# Patient Record
Sex: Female | Born: 1995 | Race: White | Hispanic: No | Marital: Single | Smoking: Current every day smoker
Health system: Southern US, Community
[De-identification: ages and names within clinical notes are randomized; demographics above are authoritative.]

## PROBLEM LIST (undated history)

## (undated) DIAGNOSIS — F1110 Opioid abuse, uncomplicated: Secondary | ICD-10-CM

## (undated) DIAGNOSIS — F1920 Other psychoactive substance dependence, uncomplicated: Secondary | ICD-10-CM

---
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
## 2017-11-03 ENCOUNTER — Telehealth: Payer: Self-pay | Admitting: Family Medicine

## 2017-11-03 NOTE — Telephone Encounter (Signed)
Spoke with sister and left a call back number regarding health maintenance appt.

## 2017-11-19 ENCOUNTER — Other Ambulatory Visit: Payer: Self-pay

## 2017-11-19 ENCOUNTER — Emergency Department (HOSPITAL_BASED_OUTPATIENT_CLINIC_OR_DEPARTMENT_OTHER)
Admission: EM | Admit: 2017-11-19 | Discharge: 2017-11-19 | Disposition: A | Discharge status: Home / Self Care | Payer: Medicaid Other | Attending: Emergency Medicine | Admitting: Emergency Medicine

## 2017-11-19 ENCOUNTER — Encounter (HOSPITAL_BASED_OUTPATIENT_CLINIC_OR_DEPARTMENT_OTHER): Payer: Self-pay | Admitting: Emergency Medicine

## 2017-11-19 DIAGNOSIS — F17210 Nicotine dependence, cigarettes, uncomplicated: Secondary | ICD-10-CM | POA: Diagnosis not present

## 2017-11-19 DIAGNOSIS — T401X1A Poisoning by heroin, accidental (unintentional), initial encounter: Secondary | ICD-10-CM | POA: Diagnosis not present

## 2017-11-19 HISTORY — DX: Opioid abuse, uncomplicated: F11.10

## 2017-11-19 MED ORDER — ONDANSETRON 8 MG PO TBDP
8.0000 mg | ORAL_TABLET | Freq: Once | ORAL | Status: AC
Start: 2017-11-19 — End: 2017-11-19
  Administered 2017-11-19: 8 mg via ORAL
  Filled 2017-11-19: qty 1

## 2017-11-19 NOTE — ED Provider Notes (Signed)
[facility] Provider Note: Lowella Dell, MD, FACEP  CSN: [id number] MRN: [id number] ARRIVAL: 11/19/17 at 0325 ROOM: MH05/MH05   CHIEF COMPLAINT  Drug Overdose   HISTORY OF PRESENT ILLNESS  11/19/17 3:36 AM Kathryn Blankenship is a 22 y.o. female with a long-standing history of heroin abuse.  Her father returned to their hotel room time ago and found her unresponsive with agonal breathing.  He administered 4 mg of IM Narcan.  Her breathing did not return to normal so he called 911.  First responders administered an additional 2 mg of intranasal Narcan with return to normal respirations.  She is now awake and alert.  She is tremulous which she attributes to narcotic withdrawal and is complaining of nausea and a headache.    Past Medical History:  Diagnosis Date  . Drug abuse and dependence (HCC)   . Heroin abuse [facility])     Past Surgical History:  Procedure Laterality Date  . I&D EXTREMITY Left 09/24/2015   Procedure: IRRIGATION AND DEBRIDEMENT LEFT FOREARM ABSCESS;  Surgeon: Dairl Ponder, MD;  Location: [facility] OR;  Service: Orthopedics;  Laterality: Left;  . INCISION AND DRAINAGE ABSCESS N/A 09/24/2015   Procedure: INCISION AND DRAINAGE BACK  ABSCESS;  Surgeon: Axel Filler, MD;  Location: [facility] OR;  Service: General;  Laterality: N/A;  . IRRIGATION AND DEBRIDEMENT ABSCESS N/A 09/28/2015   Procedure: IRRIGATION AND DEBRIDEMENT ABSCESS;  Surgeon: Jimmye Norman, MD;  Location: [facility] OR;  Service: General;  Laterality: N/A;    No family history on file.  Social History   Tobacco Use  . Smoking status: Current Some Day Smoker    Packs/day: 0.25    Types: Cigarettes  . Smokeless tobacco: Never Used  Substance Use Topics  . Alcohol use: No  . Drug use: Yes    Frequency: 7.0 times per week    Types: Marijuana, Heroin, IV    Comment: Opana, Soma, hx Heroin    Prior to Admission medications   Not on File    Allergies Patient has no known allergies.   REVIEW OF  SYSTEMS  Negative except as noted here or in the History of Present Illness.   PHYSICAL EXAMINATION  Initial Vital Signs Blood pressure 128/67, pulse (!) 101, temperature 98.5 F (36.9 C), temperature source Oral, resp. rate 15, height 5\' 5"  (1.651 m), weight 61.2 kg, SpO2 100 %.  Examination General: Well-developed, well-nourished female in no acute distress; appearance consistent with age of record HENT: normocephalic; atraumatic Eyes: pupils dilated and sluggish; extraocular muscles intact Neck: supple Heart: regular rate and rhythm Lungs: clear to auscultation bilaterally Abdomen: soft; nondistended; nontender; bowel sounds present Extremities: No deformity; full range of motion; pulses normal Neurologic: Awake, alert and oriented; motor function intact in all extremities and symmetric; no facial droop; tremulous Skin: Warm and dry Psychiatric: Flat affect   RESULTS  Summary of this visit's results, reviewed by myself:   EKG Interpretation  Date/Time:    Ventricular Rate:    PR Interval:    QRS Duration:   QT Interval:    QTC Calculation:   R Axis:     Text Interpretation:        Laboratory Studies: No results found for this or any previous visit (from the past 24 hour(s)). Imaging Studies: No results found.  ED COURSE and MDM  Nursing notes and initial vitals signs, including pulse oximetry, reviewed.  Vitals:   11/19/17 0331 11/19/17 0332 11/19/17 0400  BP:  128/67   Pulse:  Marland Kitchen)  101 97  Resp:  15 17  Temp:  98.5 F (36.9 C)   TempSrc:  Oral   SpO2:  100% 100%  Weight: 61.2 kg    Height: 5\' 5"  (1.651 m)     6:37 AM Vital signs have been stable, patient without apnea during observation in ED.  Patient sleeping but readily awakened.  PROCEDURES    ED DIAGNOSES     ICD-10-CM   1. Accidental overdose of heroin, initial encounter [facility]) T40.1X1A        Paula Libra, MD 11/19/17 [phone]

## 2017-11-19 NOTE — ED Triage Notes (Signed)
Pt was found by her father in the home unresponsive. Pt was given 6mg  narcan intranasal by [employer]. Pt brought in by EMS. Pt is alert and following commands,.

## 2018-10-01 ENCOUNTER — Encounter (HOSPITAL_BASED_OUTPATIENT_CLINIC_OR_DEPARTMENT_OTHER): Payer: Self-pay | Admitting: *Deleted

## 2018-10-01 ENCOUNTER — Other Ambulatory Visit: Payer: Self-pay

## 2018-10-01 ENCOUNTER — Emergency Department (HOSPITAL_BASED_OUTPATIENT_CLINIC_OR_DEPARTMENT_OTHER)
Admission: EM | Admit: 2018-10-01 | Discharge: 2018-10-02 | Disposition: A | Discharge status: Home / Self Care | Payer: Medicaid Other | Attending: Emergency Medicine | Admitting: Emergency Medicine

## 2018-10-01 DIAGNOSIS — L0291 Cutaneous abscess, unspecified: Secondary | ICD-10-CM

## 2018-10-01 DIAGNOSIS — N764 Abscess of vulva: Secondary | ICD-10-CM | POA: Insufficient documentation

## 2018-10-01 DIAGNOSIS — F17210 Nicotine dependence, cigarettes, uncomplicated: Secondary | ICD-10-CM | POA: Insufficient documentation

## 2018-10-01 MED ORDER — ACETAMINOPHEN 500 MG PO TABS
1000.0000 mg | ORAL_TABLET | Freq: Once | ORAL | Status: AC
  Administered 2018-10-01: 1000 mg via ORAL
  Filled 2018-10-01: qty 2

## 2018-10-01 MED ORDER — DOXYCYCLINE HYCLATE 100 MG PO TABS
100.0000 mg | ORAL_TABLET | Freq: Once | ORAL | Status: AC
  Administered 2018-10-01: 100 mg via ORAL
  Filled 2018-10-01: qty 1

## 2018-10-01 MED ORDER — NAPROXEN 250 MG PO TABS
500.0000 mg | ORAL_TABLET | Freq: Once | ORAL | Status: AC
  Administered 2018-10-01: 500 mg via ORAL
  Filled 2018-10-01: qty 2

## 2018-10-01 NOTE — ED Triage Notes (Signed)
Pt c/o right thigh abscess x 2 weeks

## 2018-10-02 ENCOUNTER — Encounter (HOSPITAL_BASED_OUTPATIENT_CLINIC_OR_DEPARTMENT_OTHER): Payer: Self-pay | Admitting: Emergency Medicine

## 2018-10-02 MED ORDER — LIDOCAINE-EPINEPHRINE (PF) 2 %-1:200000 IJ SOLN
INTRAMUSCULAR | Status: AC
  Filled 2018-10-02: qty 10

## 2018-10-02 MED ORDER — NAPROXEN 500 MG PO TABS: 500.0000 mg | ORAL_TABLET | Freq: Two times a day (BID) | ORAL | 0 refills | Status: DC

## 2018-10-02 MED ORDER — DOXYCYCLINE HYCLATE 100 MG PO CAPS: 100.0000 mg | ORAL_CAPSULE | Freq: Two times a day (BID) | ORAL | 0 refills | Status: DC

## 2018-10-02 NOTE — Discharge Instructions (Signed)
No shaving  °

## 2018-10-02 NOTE — ED Provider Notes (Signed)
[facility] EMERGENCY DEPARTMENT Provider Note   CSN: [id number] Arrival date & time: 10/01/18  2250     History   Chief Complaint Chief Complaint  Patient presents with  . Abscess    HPI Kathryn Blankenship is a 23 y.o. female.     The history is provided by the patient.  Abscess Location:  Pelvis Pelvic abscess location:  Vulva Size:  3 Abscess quality: fluctuance and painful   Red streaking: no   Duration:  2 weeks Progression:  Worsening Pain details:    Quality:  Dull   Severity:  Severe   Duration:  2 weeks   Timing:  Constant   Progression:  Worsening Chronicity:  Recurrent Context: not immunosuppression   Associated symptoms: no fever and no vomiting   Patient with MRSA and h/o heroin abuse with overdose presents with R labial abscess x 2 weeks.  No f/c/r.  No n/v/d.  No streaking.  No drainage.    Past Medical History:  Diagnosis Date  . Drug abuse and dependence (HCC)   . Heroin abuse [facility])     Patient Active Problem List   Diagnosis Date Noted  . H/O drug dependence/abuse (HCC) 11/15/2015  . 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 Disruption of tissue around surgical drain 10/24/2015  . Hepatitis C antibody test positive 09/26/2015  . 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 Abscess of left arm 09/25/2015  . Normocytic anemia 09/25/2015  . Protein-calorie malnutrition, severe (HCC) 09/25/2015  . Cigarette smoker 09/25/2015  . 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 Heroin abuse (HCC) 09/24/2015  . Sepsis (HCC) 09/24/2015    Past Surgical History:  Procedure Laterality Date  . I&D EXTREMITY Left 09/24/2015   Procedure: IRRIGATION AND DEBRIDEMENT LEFT FOREARM ABSCESS;  Surgeon: Dairl Ponder, MD;  Location: [facility] OR;  Service: Orthopedics;  Laterality: Left;  . INCISION AND DRAINAGE ABSCESS N/A 09/24/2015   Procedure: INCISION AND DRAINAGE BACK  ABSCESS;  Surgeon: Axel Filler, MD;  Location: [facility] OR;  Service: General;  Laterality:  N/A;  . IRRIGATION AND DEBRIDEMENT ABSCESS N/A 09/28/2015   Procedure: IRRIGATION AND DEBRIDEMENT ABSCESS;  Surgeon: Jimmye Norman, MD;  Location: [facility] OR;  Service: General;  Laterality: N/A;     OB History   No obstetric history on file.      Home Medications    Prior to Admission medications   Medication Sig Start Date End Date Taking? Authorizing Provider  doxycycline (VIBRAMYCIN) 100 MG capsule Take 1 capsule (100 mg total) by mouth 2 (two) times daily. One po bid x 7 days 10/02/18   Myrian Botello, MD  naproxen (NAPROSYN) 500 MG tablet Take 1 tablet (500 mg total) by mouth 2 (two) times daily with a meal. 10/02/18   Gianina Olinde, MD    Family History No family history on file.  Social History Social History   Tobacco Use  . Smoking status: Current Some Day Smoker    Packs/day: 0.50    Types: Cigarettes  . Smokeless tobacco: Never Used  Substance Use Topics  . Alcohol use: No  . Drug use: Yes    Frequency: 7.0 times per week    Types: Marijuana, Heroin, IV    Comment: Opana, Soma, hx Heroin     Allergies   Penicillins   Review of Systems Review of Systems  Constitutional: Negative for fever.  HENT: Negative for congestion.   Eyes: Negative for visual disturbance.  Respiratory: Negative for wheezing.   Cardiovascular: Negative for  chest pain.  Gastrointestinal: Negative for abdominal distention, diarrhea and vomiting.  Genitourinary: Negative for difficulty urinating.  Musculoskeletal: Negative for arthralgias.  Skin: Negative for wound.  Neurological: Negative for dizziness.  Psychiatric/Behavioral: Negative for agitation.  All other systems reviewed and are negative.    Physical Exam Updated Vital Signs BP 101/77   Pulse (!) 102   Temp 99 F (37.2 C) (Oral)   Resp 16   Ht 5\' 5"  (1.651 m)   Wt 50.3 kg   LMP 08/26/2018   SpO2 100%   BMI 18.47 kg/m   Physical Exam Vitals signs and nursing note reviewed.  Constitutional:      General: She is  not in acute distress.    Appearance: She is normal weight.  HENT:     Head: Normocephalic and atraumatic.     Nose: Nose normal.  Eyes:     Conjunctiva/sclera: Conjunctivae normal.     Pupils: Pupils are equal, round, and reactive to light.  Neck:     Musculoskeletal: Normal range of motion and neck supple.  Cardiovascular:     Rate and Rhythm: Normal rate and regular rhythm.     Pulses: Normal pulses.     Heart sounds: Normal heart sounds.  Pulmonary:     Effort: Pulmonary effort is normal.     Breath sounds: Normal breath sounds.  Abdominal:     General: Abdomen is flat. Bowel sounds are normal.     Tenderness: There is no abdominal tenderness. There is no guarding or rebound.  Genitourinary:    Comments: Labial abscess inspected with chaperone Kathryn Blankenship present.   Musculoskeletal: Normal range of motion.  Skin:    General: Skin is warm and dry.       Neurological:     General: No focal deficit present.     Mental Status: She is alert and oriented to person, place, and time.  Psychiatric:        Mood and Affect: Mood normal.        Behavior: Behavior normal.      ED Treatments / Results  Labs (all labs ordered are listed, but only abnormal results are displayed) Labs Reviewed - No data to display  EKG None  Radiology No results found.  Procedures .Marland Kitchen.Incision and Drainage  Date/Time: 10/02/2018 12:14 AM Performed by: Cy BlamerPalumbo, Elfreida Heggs, MD Authorized by: Cy BlamerPalumbo, Kosisochukwu Goldberg, MD   Consent:    Consent obtained:  Verbal   Consent given by:  Patient   Risks discussed:  Bleeding, damage to other organs, infection, incomplete drainage and pain   Alternatives discussed:  No treatment Location:    Type:  Abscess (labial R)   Size:  3   Location:  Anogenital   Anogenital location: labial R. Pre-procedure details:    Skin preparation:  Betadine and Chloraprep (4) Anesthesia (see MAR for exact dosages):    Anesthesia method:  Local infiltration   Local anesthetic:   Lidocaine 1% WITH epi Procedure type:    Complexity:  Simple Procedure details:    Incision types:  Elliptical   Incision depth:  Subcutaneous   Scalpel blade:  11   Wound management:  Probed and deloculated   Drainage:  Purulent   Drainage amount:  Copious   Wound treatment:  Wound left open   Packing materials:  None Post-procedure details:    Patient tolerance of procedure:  Tolerated well, no immediate complications   (including critical care time)  Medications Ordered in ED Medications  lidocaine-EPINEPHrine (XYLOCAINE W/EPI)  2 %-1:200000 (PF) injection (has no administration in time range)  doxycycline (VIBRA-TABS) tablet 100 mg (100 mg Oral Given 10/01/18 2351)  acetaminophen (TYLENOL) tablet 1,000 mg (1,000 mg Oral Given 10/01/18 2351)  naproxen (NAPROSYN) tablet 500 mg (500 mg Oral Given 10/01/18 2351)     Copious purulent drainage.  No shaving.  Take all antibiotics, wound care instructions given.  Will cover with Doxycycline given history of MRSA.  I will not be giving narcotics given ongoing heroin abuse and history of overdose.    Channelle Bottger was evaluated in Emergency Department on 10/02/2018 for the symptoms described in the history of present illness. She was evaluated in the context of the global COVID-19 pandemic, which necessitated consideration that the patient might be at risk for infection with the SARS-CoV-2 virus that causes COVID-19. Institutional protocols and algorithms that pertain to the evaluation of patients at risk for COVID-19 are in a state of rapid change based on information released by regulatory bodies including the CDC and federal and state organizations. These policies and algorithms were followed during the patient's care in the ED.   Final Clinical Impressions(s) / ED Diagnoses   Final diagnoses:  Abscess   Return for intractable cough, coughing up blood,fevers >100.4 unrelieved by medication, shortness of breath, intractable  vomiting, chest pain, shortness of breath, weakness,numbness, changes in speech, facial asymmetry,abdominal pain, passing out,Inability to tolerate liquids or food, cough, altered mental status or any concerns. No signs of systemic illness or infection. The patient is nontoxic-appearing on exam and vital signs are within normal limits.   I have reviewed the triage vital signs and the nursing notes. Pertinent labs &imaging results that were available during my care of the patient were reviewed by me and considered in my medical decision making (see chart for details).  After history, exam, and medical workup I feel the patient has been appropriately medically screened and is safe for discharge home. Pertinent diagnoses were discussed with the patient. Patient was given return precautions  ED Discharge Orders         Ordered    naproxen (NAPROSYN) 500 MG tablet  2 times daily with meals     10/02/18 0013    doxycycline (VIBRAMYCIN) 100 MG capsule  2 times daily     10/02/18 0013           Darah Simkin, MD 10/02/18 0020

## 2019-03-03 ENCOUNTER — Emergency Department (HOSPITAL_BASED_OUTPATIENT_CLINIC_OR_DEPARTMENT_OTHER)
Admission: EM | Admit: 2019-03-03 | Discharge: 2019-03-03 | Disposition: A | Discharge status: Home / Self Care | Payer: Medicaid Other | Attending: Emergency Medicine | Admitting: Emergency Medicine

## 2019-03-03 ENCOUNTER — Encounter (HOSPITAL_BASED_OUTPATIENT_CLINIC_OR_DEPARTMENT_OTHER): Payer: Self-pay

## 2019-03-03 ENCOUNTER — Other Ambulatory Visit: Payer: Self-pay

## 2019-03-03 DIAGNOSIS — Z711 Person with feared health complaint in whom no diagnosis is made: Secondary | ICD-10-CM

## 2019-03-03 DIAGNOSIS — N898 Other specified noninflammatory disorders of vagina: Secondary | ICD-10-CM | POA: Insufficient documentation

## 2019-03-03 DIAGNOSIS — N730 Acute parametritis and pelvic cellulitis: Secondary | ICD-10-CM | POA: Insufficient documentation

## 2019-03-03 DIAGNOSIS — F17210 Nicotine dependence, cigarettes, uncomplicated: Secondary | ICD-10-CM | POA: Insufficient documentation

## 2019-03-03 DIAGNOSIS — Z202 Contact with and (suspected) exposure to infections with a predominantly sexual mode of transmission: Secondary | ICD-10-CM | POA: Insufficient documentation

## 2019-03-03 LAB — URINALYSIS, ROUTINE W REFLEX MICROSCOPIC
Bilirubin Urine: NEGATIVE
Glucose, UA: NEGATIVE mg/dL
Ketones, ur: NEGATIVE mg/dL
Nitrite: POSITIVE — AB
Protein, ur: 100 mg/dL — AB
Specific Gravity, Urine: >1.03 — ABNORMAL HIGH (ref 1.005–1.030)
pH: 6 (ref 5.0–8.0)

## 2019-03-03 LAB — URINALYSIS, MICROSCOPIC (REFLEX)

## 2019-03-03 LAB — PREGNANCY, URINE: Preg Test, Ur: NEGATIVE

## 2019-03-03 MED ORDER — METRONIDAZOLE 500 MG PO TABS
2000.0000 mg | ORAL_TABLET | Freq: Once | ORAL | Status: AC
  Administered 2019-03-03: 2000 mg via ORAL
  Filled 2019-03-03: qty 4

## 2019-03-03 MED ORDER — CEFTRIAXONE SODIUM 500 MG IJ SOLR
500.0000 mg | Freq: Once | INTRAMUSCULAR | Status: AC
  Administered 2019-03-03: 500 mg via INTRAMUSCULAR
  Filled 2019-03-03: qty 500

## 2019-03-03 MED ORDER — DOXYCYCLINE HYCLATE 100 MG PO CAPS: 100.0000 mg | ORAL_CAPSULE | Freq: Two times a day (BID) | ORAL | 0 refills | Status: DC

## 2019-03-03 NOTE — ED Notes (Signed)
ED Provider at bedside. 

## 2019-03-03 NOTE — ED Notes (Signed)
Informed nurse that she has not had a prior pelvic exam. ED PA informed of same

## 2019-03-03 NOTE — ED Triage Notes (Signed)
Pt c/o vaginal d/c x 4 days-NAD-steady gait

## 2019-03-03 NOTE — ED Provider Notes (Signed)
[facility] EMERGENCY DEPARTMENT Provider Note   CSN: [id number] Arrival date & time: 03/03/19  1306     History Chief Complaint  Patient presents with  . Vaginal Discharge    Kathryn Blankenship is a 23 y.o. female.  HPI Patient presents to the emergency department with vaginal discharge that started 4 days ago.  The patient states that she has pain with urination and lower pelvic pain as well.  The patient states that she has had recent unprotected sex.  She states that she is never had an STD in the past.  The patient states that the pain increases after urination.  Patient denies any external rashes.  The patient denies chest pain, shortness of breath, headache,blurred vision, neck pain, fever, cough, weakness, numbness, dizziness, anorexia, edema,, nausea, vomiting, diarrhea, rash, back pain, dysuria, hematemesis, bloody stool, near syncope, or syncope.  Past Medical History:  Diagnosis Date  . Drug abuse and dependence (HCC)   . Heroin abuse [facility])     Patient Active Problem List   Diagnosis Date Noted  . H/O drug dependence/abuse (HCC) 11/15/2015  . 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 Disruption of tissue around surgical drain 10/24/2015  . Hepatitis C antibody test positive 09/26/2015  . 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 Abscess of left arm 09/25/2015  . Normocytic anemia 09/25/2015  . Protein-calorie malnutrition, severe (HCC) 09/25/2015  . Cigarette smoker 09/25/2015  . 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 Heroin abuse (HCC) 09/24/2015  . Sepsis (HCC) 09/24/2015    Past Surgical History:  Procedure Laterality Date  . I & D EXTREMITY Left 09/24/2015   Procedure: IRRIGATION AND DEBRIDEMENT LEFT FOREARM ABSCESS;  Surgeon: Dairl Ponder, MD;  Location: [facility] OR;  Service: Orthopedics;  Laterality: Left;  . INCISION AND DRAINAGE ABSCESS N/A 09/24/2015   Procedure: INCISION AND DRAINAGE BACK  ABSCESS;  Surgeon: Axel Filler, MD;  Location: [facility] OR;   Service: General;  Laterality: N/A;  . IRRIGATION AND DEBRIDEMENT ABSCESS N/A 09/28/2015   Procedure: IRRIGATION AND DEBRIDEMENT ABSCESS;  Surgeon: Jimmye Norman, MD;  Location: [facility] OR;  Service: General;  Laterality: N/A;     OB History   No obstetric history on file.     No family history on file.  Social History   Tobacco Use  . Smoking status: Current Some Day Smoker    Packs/day: 0.50    Types: Cigarettes  . Smokeless tobacco: Never Used  Substance Use Topics  . Alcohol use: No  . Drug use: Not Currently    Types: Heroin    Home Medications Prior to Admission medications   Medication Sig Start Date End Date Taking? Authorizing Provider  doxycycline (VIBRAMYCIN) 100 MG capsule Take 1 capsule (100 mg total) by mouth 2 (two) times daily. One po bid x 7 days 10/02/18   Palumbo, April, MD  naproxen (NAPROSYN) 500 MG tablet Take 1 tablet (500 mg total) by mouth 2 (two) times daily with a meal. 10/02/18   Palumbo, April, MD    Allergies    Penicillins  Review of Systems   Review of Systems All other systems negative except as documented in the HPI. All pertinent positives and negatives as reviewed in the HPI. Physical Exam Updated Vital Signs BP (!) 98/56 (BP Location: Right Arm)   Pulse 60   Temp 97.7 F (36.5 C) (Oral)   Resp 16   Ht 5\' 5"  (1.651 m)   Wt 48.6 kg   LMP 02/24/2019 (Approximate)  SpO2 100%   BMI 17.84 kg/m   Physical Exam Vitals and nursing note reviewed. Exam conducted with a chaperone present.  Constitutional:      General: She is not in acute distress.    Appearance: She is well-developed.  HENT:     Head: Normocephalic and atraumatic.  Eyes:     Pupils: Pupils are equal, round, and reactive to light.  Cardiovascular:     Rate and Rhythm: Normal rate and regular rhythm.  Pulmonary:     Effort: Pulmonary effort is normal.  Abdominal:     Tenderness: There is abdominal tenderness in the suprapubic area.  Genitourinary:    Pubic Area: No  rash.      Labia:        Right: No rash, tenderness or lesion.        Left: No rash, tenderness or lesion.      Vagina: Vaginal discharge present.     Cervix: Cervical motion tenderness present.  Skin:    General: Skin is warm and dry.  Neurological:     Mental Status: She is alert and oriented to person, place, and time.     ED Results / Procedures / Treatments   Labs (all labs ordered are listed, but only abnormal results are displayed) Labs Reviewed  URINALYSIS, ROUTINE W REFLEX MICROSCOPIC - Abnormal; Notable for the following components:      Result Value   Color, Urine AMBER (*)    APPearance TURBID (*)    Specific Gravity, Urine >1.030 (*)    Hgb urine dipstick LARGE (*)    Protein, ur 100 (*)    Nitrite POSITIVE (*)    Leukocytes,Ua MODERATE (*)    All other components within normal limits  URINALYSIS, MICROSCOPIC (REFLEX) - Abnormal; Notable for the following components:   Bacteria, UA MANY (*)    Trichomonas, UA PRESENT (*)    All other components within normal limits  PREGNANCY, URINE  GC/CHLAMYDIA PROBE AMP ([place]) NOT AT [facility]    EKG None  Radiology No results found.  Procedures Procedures (including critical care time)  Medications Ordered in ED Medications  cefTRIAXone (ROCEPHIN) injection 500 mg (500 mg Intramuscular Given 03/03/19 1544)  metroNIDAZOLE (FLAGYL) tablet 2,000 mg (2,000 mg Oral Given 03/03/19 1544)    ED Course  I have reviewed the triage vital signs and the nursing notes.  Pertinent labs & imaging results that were available during my care of the patient were reviewed by me and considered in my medical decision making (see chart for details).    MDM Rules/Calculators/A&P                      Patient will be treated for STDs.  The patient is advised to not have any sexual contact over the next 7 days.  Patient agrees the plan and all questions were answered. Final Clinical Impression(s) / ED Diagnoses Final diagnoses:    None    Rx / DC Orders ED Discharge Orders    None       Dalia Heading, PA-C 03/03/19 1606    Fredia Sorrow, MD 03/13/19 [phone]

## 2019-03-03 NOTE — Discharge Instructions (Addendum)
Return here as needed.  Make sure you take the full course of the antibiotics.  No sexual contact over the next 7 days.

## 2019-03-04 LAB — GC/CHLAMYDIA PROBE AMP (~~LOC~~) NOT AT ARMC
Chlamydia: POSITIVE — AB
Neisseria Gonorrhea: POSITIVE — AB

## 2020-01-25 ENCOUNTER — Other Ambulatory Visit: Payer: Self-pay

## 2020-01-25 ENCOUNTER — Emergency Department (HOSPITAL_COMMUNITY): Payer: Medicaid Other

## 2020-01-25 ENCOUNTER — Inpatient Hospital Stay (HOSPITAL_COMMUNITY)
Admission: AD | Admit: 2020-01-25 | Discharge: 2020-01-26 | Disposition: A | Discharge status: Home / Self Care | Payer: Medicaid Other | Attending: Emergency Medicine | Admitting: Emergency Medicine

## 2020-01-25 DIAGNOSIS — O98511 Other viral diseases complicating pregnancy, first trimester: Secondary | ICD-10-CM | POA: Insufficient documentation

## 2020-01-25 DIAGNOSIS — Z3A09 9 weeks gestation of pregnancy: Secondary | ICD-10-CM | POA: Insufficient documentation

## 2020-01-25 DIAGNOSIS — F1110 Opioid abuse, uncomplicated: Secondary | ICD-10-CM

## 2020-01-25 DIAGNOSIS — F1120 Opioid dependence, uncomplicated: Secondary | ICD-10-CM | POA: Insufficient documentation

## 2020-01-25 DIAGNOSIS — O99331 Smoking (tobacco) complicating pregnancy, first trimester: Secondary | ICD-10-CM | POA: Diagnosis not present

## 2020-01-25 DIAGNOSIS — O99321 Drug use complicating pregnancy, first trimester: Secondary | ICD-10-CM | POA: Diagnosis not present

## 2020-01-25 DIAGNOSIS — O26891 Other specified pregnancy related conditions, first trimester: Secondary | ICD-10-CM | POA: Diagnosis present

## 2020-01-25 DIAGNOSIS — A599 Trichomoniasis, unspecified: Secondary | ICD-10-CM | POA: Diagnosis not present

## 2020-01-25 DIAGNOSIS — O30031 Twin pregnancy, monochorionic/diamniotic, first trimester: Secondary | ICD-10-CM

## 2020-01-25 DIAGNOSIS — U071 COVID-19: Secondary | ICD-10-CM | POA: Diagnosis not present

## 2020-01-25 DIAGNOSIS — Z3A08 8 weeks gestation of pregnancy: Secondary | ICD-10-CM

## 2020-01-25 DIAGNOSIS — Z3490 Encounter for supervision of normal pregnancy, unspecified, unspecified trimester: Secondary | ICD-10-CM

## 2020-01-25 DIAGNOSIS — F17210 Nicotine dependence, cigarettes, uncomplicated: Secondary | ICD-10-CM | POA: Diagnosis not present

## 2020-01-25 LAB — CBC WITH DIFFERENTIAL/PLATELET
Abs Immature Granulocytes: 0.04 10*3/uL (ref 0.00–0.07)
Basophils Absolute: 0 10*3/uL (ref 0.0–0.1)
Basophils Relative: 0 %
Eosinophils Absolute: 0 10*3/uL (ref 0.0–0.5)
Eosinophils Relative: 0 %
HCT: 33.4 % — ABNORMAL LOW (ref 36.0–46.0)
Hemoglobin: 10.3 g/dL — ABNORMAL LOW (ref 12.0–15.0)
Immature Granulocytes: 1 %
Lymphocytes Relative: 13 %
Lymphs Abs: 0.6 10*3/uL — ABNORMAL LOW (ref 0.7–4.0)
MCH: 24.6 pg — ABNORMAL LOW (ref 26.0–34.0)
MCHC: 30.8 g/dL (ref 30.0–36.0)
MCV: 79.7 fL — ABNORMAL LOW (ref 80.0–100.0)
Monocytes Absolute: 0.9 10*3/uL (ref 0.1–1.0)
Monocytes Relative: 18 %
Neutro Abs: 3.4 10*3/uL (ref 1.7–7.7)
Neutrophils Relative %: 68 %
Platelets: 288 10*3/uL (ref 150–400)
RBC: 4.19 MIL/uL (ref 3.87–5.11)
RDW: 15.5 % (ref 11.5–15.5)
WBC: 4.9 10*3/uL (ref 4.0–10.5)
nRBC: 0 % (ref 0.0–0.2)

## 2020-01-25 LAB — URINALYSIS, ROUTINE W REFLEX MICROSCOPIC
Bilirubin Urine: NEGATIVE
Glucose, UA: NEGATIVE mg/dL
Ketones, ur: 5 mg/dL — AB
Nitrite: NEGATIVE
Protein, ur: NEGATIVE mg/dL
Specific Gravity, Urine: 1.021 (ref 1.005–1.030)
pH: 5 (ref 5.0–8.0)

## 2020-01-25 LAB — COMPREHENSIVE METABOLIC PANEL
ALT: 37 U/L (ref 0–44)
AST: 28 U/L (ref 15–41)
Albumin: 3.8 g/dL (ref 3.5–5.0)
Alkaline Phosphatase: 45 U/L (ref 38–126)
Anion gap: 11 (ref 5–15)
BUN: 8 mg/dL (ref 6–20)
CO2: 21 mmol/L — ABNORMAL LOW (ref 22–32)
Calcium: 8.9 mg/dL (ref 8.9–10.3)
Chloride: 97 mmol/L — ABNORMAL LOW (ref 98–111)
Creatinine, Ser: 0.62 mg/dL (ref 0.44–1.00)
GFR, Estimated: >60 mL/min (ref >60)
Glucose, Bld: 97 mg/dL (ref 70–99)
Potassium: 3.8 mmol/L (ref 3.5–5.1)
Sodium: 129 mmol/L — ABNORMAL LOW (ref 135–145)
Total Bilirubin: 0.7 mg/dL (ref 0.3–1.2)
Total Protein: 7.8 g/dL (ref 6.5–8.1)

## 2020-01-25 LAB — I-STAT BETA HCG BLOOD, ED (MC, WL, AP ONLY): I-stat hCG, quantitative: >2000 m[IU]/mL — ABNORMAL HIGH (ref <5)

## 2020-01-25 NOTE — ED Triage Notes (Signed)
PA Mia screened pt, pt currently endorsing covid symptoms (loss of smell & taste, can't breathe, bodyaches, nauseous). Known contact with covid+ dad.

## 2020-01-25 NOTE — ED Triage Notes (Signed)
Pt BIB [employer], needs medical screening to go to jail. Previous temp reported by [employer] was 100.0, temp on arrival 98.7. PA Mia notified to come screen pt for [facility] dispo.

## 2020-01-26 ENCOUNTER — Inpatient Hospital Stay (HOSPITAL_COMMUNITY): Payer: Medicaid Other

## 2020-01-26 ENCOUNTER — Encounter (HOSPITAL_COMMUNITY): Payer: Self-pay | Admitting: *Deleted

## 2020-01-26 LAB — WET PREP, GENITAL
Clue Cells Wet Prep HPF POC: NONE SEEN
Sperm: NONE SEEN
Yeast Wet Prep HPF POC: NONE SEEN

## 2020-01-26 LAB — RESP PANEL BY RT-PCR (FLU A&B, COVID) ARPGX2
Influenza A by PCR: NEGATIVE
Influenza B by PCR: NEGATIVE
SARS Coronavirus 2 by RT PCR: POSITIVE — AB

## 2020-01-26 LAB — HCG, QUANTITATIVE, PREGNANCY: hCG, Beta Chain, Quant, S: 206867 m[IU]/mL — ABNORMAL HIGH (ref <5)

## 2020-01-26 LAB — GC/CHLAMYDIA PROBE AMP (~~LOC~~) NOT AT ARMC
Chlamydia: NEGATIVE
Comment: NEGATIVE
Comment: NORMAL
Neisseria Gonorrhea: NEGATIVE

## 2020-01-26 MED ORDER — ONDANSETRON 4 MG PO TBDP
8.0000 mg | ORAL_TABLET | Freq: Once | ORAL | Status: AC
  Administered 2020-01-26: 8 mg via ORAL
  Filled 2020-01-26: qty 2

## 2020-01-26 MED ORDER — ONDANSETRON HCL 4 MG/2ML IJ SOLN
INTRAMUSCULAR | Status: AC
  Filled 2020-01-26: qty 2

## 2020-01-26 MED ORDER — METRONIDAZOLE 500 MG PO TABS: 2000.0000 mg | ORAL_TABLET | Freq: Once | ORAL | 0 refills | Status: AC

## 2020-01-26 MED ORDER — METRONIDAZOLE 500 MG PO TABS
2000.0000 mg | ORAL_TABLET | Freq: Once | ORAL | Status: AC
  Administered 2020-01-26: 2000 mg via ORAL
  Filled 2020-01-26: qty 4

## 2020-01-26 NOTE — MAU Provider Note (Signed)
History     CSN: [id number]  Arrival date and time: 01/25/20 2221   Event Date/Time   First Provider Initiated Contact with Patient 01/26/20 0138      Chief Complaint  Patient presents with  . Fever   Kathryn Blankenship is a 24 y.o. G1P0 at [redacted]w[redacted]d by Unsure LMP of 11/19/2019.  She presents today for Fever.  She was transferred from [facility] with positive UPT and c/o abdominal pain and vaginal discharge.  However, upon provider arrival to bedside, patient asleep.  Patient unable to state why she has requested medical evaluation today. However, with extensive prompting, provider able to ilicit some responses. Patient able to state she is having discharge that started 2-3 days ago with odor.  Patient denies pain or vaginal bleeding. Patient reports last drug usage was this morning.     OB History    Gravida  1   Para      Term      Preterm      AB      Living        SAB      IAB      Ectopic      Multiple      Live Births              Past Medical History:  Diagnosis Date  . Drug abuse and dependence (HCC)   . Heroin abuse [facility])     Past Surgical History:  Procedure Laterality Date  . I & D EXTREMITY Left 09/24/2015   Procedure: IRRIGATION AND DEBRIDEMENT LEFT FOREARM ABSCESS;  Surgeon: Dairl Ponder, MD;  Location: [facility] OR;  Service: Orthopedics;  Laterality: Left;  . INCISION AND DRAINAGE ABSCESS N/A 09/24/2015   Procedure: INCISION AND DRAINAGE BACK  ABSCESS;  Surgeon: Axel Filler, MD;  Location: [facility] OR;  Service: General;  Laterality: N/A;  . IRRIGATION AND DEBRIDEMENT ABSCESS N/A 09/28/2015   Procedure: IRRIGATION AND DEBRIDEMENT ABSCESS;  Surgeon: Jimmye Norman, MD;  Location: [facility] OR;  Service: General;  Laterality: N/A;    History reviewed. No pertinent family history.  Social History   Tobacco Use  . Smoking status: Current Some Day Smoker    Packs/day: 0.50    Types: Cigarettes  . Smokeless tobacco: Never Used  Vaping Use  . Vaping Use: Never used   Substance Use Topics  . Alcohol use: No  . Drug use: Not Currently    Types: Heroin    Allergies:  Allergies  Allergen Reactions  . Penicillins Hives    Medications Prior to Admission  Medication Sig Dispense Refill Last Dose  . doxycycline (VIBRAMYCIN) 100 MG capsule Take 1 capsule (100 mg total) by mouth 2 (two) times daily. 28 capsule 0 More than a month at Unknown time  . naproxen (NAPROSYN) 500 MG tablet Take 1 tablet (500 mg total) by mouth 2 (two) times daily with a meal. 8 tablet 0 More than a month at Unknown time    Review of Systems  Constitutional: Positive for chills and fever.  Respiratory: Positive for cough and shortness of breath.   Genitourinary: Negative for difficulty urinating, dysuria, vaginal bleeding and vaginal discharge.   Physical Exam   Blood pressure (!) 109/59, pulse 96, temperature 98.5 F (36.9 C), temperature source Oral, resp. rate 20, height 5\' 6"  (1.676 m), weight 54.4 kg, last menstrual period 11/19/2019, SpO2 99 %.  Physical Exam Constitutional:      Appearance: Normal appearance.  HENT:     Head:  Normocephalic and atraumatic.  Eyes:     Conjunctiva/sclera: Conjunctivae normal.  Cardiovascular:     Rate and Rhythm: Normal rate and regular rhythm.     Heart sounds: Normal heart sounds.  Pulmonary:     Effort: Pulmonary effort is normal. No respiratory distress.     Breath sounds: Normal breath sounds.  Abdominal:     General: Bowel sounds are normal.     Palpations: Abdomen is soft.     Tenderness: There is no abdominal tenderness.  Genitourinary:    Comments: NEFG.  No apparent lesions, trauma, or discharge.  CV collected blindly.  Musculoskeletal:        General: Normal range of motion.     Cervical back: Normal range of motion.  Skin:    General: Skin is warm and dry.     Findings: Bruising (Bilateral Legs likely from drug use) and lesion present.  Neurological:     Mental Status: She is alert and oriented to person,  place, and time.  Psychiatric:        Mood and Affect: Mood normal.        Behavior: Behavior normal.        Thought Content: Thought content normal.     MAU Course  Procedures Results for orders placed or performed during the hospital encounter of 01/25/20 (from the past 24 hour(s))  Resp Panel by RT-PCR (Flu A&B, Covid) Nasopharyngeal Swab     Status: Abnormal   Collection Time: 01/25/20 10:47 PM   Specimen: Nasopharyngeal Swab; Nasopharyngeal(NP) swabs in vial transport medium  Result Value Ref Range   SARS Coronavirus 2 by RT PCR POSITIVE (A) NEGATIVE   Influenza A by PCR NEGATIVE NEGATIVE   Influenza B by PCR NEGATIVE NEGATIVE  Urinalysis, Routine w reflex microscopic Urine, Clean Catch     Status: Abnormal   Collection Time: 01/25/20 10:47 PM  Result Value Ref Range   Color, Urine AMBER (A) YELLOW   APPearance CLOUDY (A) CLEAR   Specific Gravity, Urine 1.021 1.005 - 1.030   pH 5.0 5.0 - 8.0   Glucose, UA NEGATIVE NEGATIVE mg/dL   Hgb urine dipstick SMALL (A) NEGATIVE   Bilirubin Urine NEGATIVE NEGATIVE   Ketones, ur 5 (A) NEGATIVE mg/dL   Protein, ur NEGATIVE NEGATIVE mg/dL   Nitrite NEGATIVE NEGATIVE   Leukocytes,Ua SMALL (A) NEGATIVE   RBC / HPF 6-10 0 - 5 RBC/hpf   WBC, UA 11-20 0 - 5 WBC/hpf   Bacteria, UA FEW (A) NONE SEEN   Squamous Epithelial / LPF 0-5 0 - 5   Mucus PRESENT    Non Squamous Epithelial 0-5 (A) NONE SEEN  CBC with Differential     Status: Abnormal   Collection Time: 01/25/20 10:54 PM  Result Value Ref Range   WBC 4.9 4.0 - 10.5 K/uL   RBC 4.19 3.87 - 5.11 MIL/uL   Hemoglobin 10.3 (L) 12.0 - 15.0 g/dL   HCT 15.4 (L) 00.8 - 67.6 %   MCV 79.7 (L) 80.0 - 100.0 fL   MCH 24.6 (L) 26.0 - 34.0 pg   MCHC 30.8 30.0 - 36.0 g/dL   RDW 19.5 09.3 - 26.7 %   Platelets 288 150 - 400 K/uL   nRBC 0.0 0.0 - 0.2 %   Neutrophils Relative % 68 %   Neutro Abs 3.4 1.7 - 7.7 K/uL   Lymphocytes Relative 13 %   Lymphs Abs 0.6 (L) 0.7 - 4.0 K/uL   Monocytes  Relative 18 %  Monocytes Absolute 0.9 0.1 - 1.0 K/uL   Eosinophils Relative 0 %   Eosinophils Absolute 0.0 0.0 - 0.5 K/uL   Basophils Relative 0 %   Basophils Absolute 0.0 0.0 - 0.1 K/uL   Immature Granulocytes 1 %   Abs Immature Granulocytes 0.04 0.00 - 0.07 K/uL  Comprehensive metabolic panel     Status: Abnormal   Collection Time: 01/25/20 10:54 PM  Result Value Ref Range   Sodium 129 (L) 135 - 145 mmol/L   Potassium 3.8 3.5 - 5.1 mmol/L   Chloride 97 (L) 98 - 111 mmol/L   CO2 21 (L) 22 - 32 mmol/L   Glucose, Bld 97 70 - 99 mg/dL   BUN 8 6 - 20 mg/dL   Creatinine, Ser 1.41 0.44 - 1.00 mg/dL   Calcium 8.9 8.9 - 03.0 mg/dL   Total Protein 7.8 6.5 - 8.1 g/dL   Albumin 3.8 3.5 - 5.0 g/dL   AST 28 15 - 41 U/L   ALT 37 0 - 44 U/L   Alkaline Phosphatase 45 38 - 126 U/L   Total Bilirubin 0.7 0.3 - 1.2 mg/dL   GFR, Estimated >13 >14 mL/min   Anion gap 11 5 - 15  I-Stat Beta hCG blood, ED ([facility], [facility], [facility] only)     Status: Abnormal   Collection Time: 01/25/20 11:00 PM  Result Value Ref Range   I-stat hCG, quantitative >2,000.0 (H) <5 mIU/mL   Comment 3          hCG, quantitative, pregnancy     Status: Abnormal   Collection Time: 01/25/20 11:11 PM  Result Value Ref Range   hCG, Beta Chain, Quant, S 206,867 (H) <5 mIU/mL    MDM Physical Exam Wet Prep and GC/CT Labs: UC  BSUS-Formal Ultrasound Assessment and Plan  24 year old G1P0 at 9.5 weeks by Unsure LMP Vaginal Discharge Heroin Usage  -POC discussed. -Exam performed. -BSUS performed and suspicious for TIUP with HRs at 179 & 167. -Unable to distinguish dividing membrane. -Formal BSUS ordered. -Cultures collected blindly. -Will await results  Cherre Robins 01/26/2020, 1:38 AM   Reassessment (2:50 AM) -Wet prep returns positive for trich. -Metronidazole ordered. -Nurse calls out and reports patient with vomiting after one pill. -Zofran 8mg  ODT ordered. -Will give time for nausea to resolve and give additional  dose. -Nurse instructed to give crackers and ginger ale as well. -Will reassess  Reassessment (3:07 AM)  - results return and conform twin gestation-mono/dia -Patient refuses 2nd administration of metronidazole per nurse. -Provider to bedside and informs patient of results. -Reviewed EDD of 09/06/2020. -Rx printed and given to police officers who are transporting patient.  -Encouraged to call or return to MAU if symptoms worsen or with the onset of new symptoms. -Discharged to policy custody in stable condition.   11/06/2020 MSN, CNM Advanced Practice Provider, [facility]

## 2020-01-26 NOTE — MAU Note (Signed)
PT SAYS LMP WAS  10-15.  LAST SEX- 2 WEEKS AGO . LAST TIME TOOK HEROIN USED Tuesday - 12 NOON. PT THINKS SHE HAS COVID- BY SYMPTOMS- FEELS COLD, CAN'T TASTE ., BODY ACHES .   SAYS HER DAD HAS COVID- Tuesday .

## 2020-01-26 NOTE — ED Provider Notes (Signed)
[facility] Provider Note   CSN: [id number] Arrival date & time: 01/25/20  2221     History Chief Complaint  Patient presents with  . Fever    Kathryn PacasOlivia Favata is a 24 y.o. female with a history of IV heroin use, MRSA thoracic spinal abscess, vertebral osteomyelitis, pathologic C6 fracture who presents the emergency department accompanied by police for medical clearance for jail.  [employer] reported a temp of 100.0 on arrival to the jail and the patient was sent for medical clearance.  The patient states that she has been feeling poorly for the last few days.  She is endorsing subjective fever, chills, generalized myalgias, fatigue, shortness of breath, cough, abdominal pain, vaginal discharge, dysuria, difficulty urinating.   She denies vaginal bleeding, chest pain, back pain, or other complaints at this time.  No treatment prior to arrival.  She has not had a menstrual cycle for 3 months and is concerned that she might be pregnant.  She continues to use IV heroin, last use was this afternoon.  The patient reports that her father tested positive for COVID-19 three days ago.   The history is provided by the patient and medical records. No language interpreter was used.       Past Medical History:  Diagnosis Date  . Drug abuse and dependence (HCC)   . Heroin abuse [facility](HCC)     Patient Active Problem List   Diagnosis Date Noted  . H/O drug dependence/abuse (HCC) 11/15/2015  . 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 Disruption of tissue around surgical drain 10/24/2015  . Hepatitis C antibody test positive 09/26/2015  . 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 Abscess of left arm 09/25/2015  . Normocytic anemia 09/25/2015  . Protein-calorie malnutrition, severe (HCC) 09/25/2015  . Cigarette smoker 09/25/2015  . 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 Heroin abuse (HCC) 09/24/2015  . Sepsis (HCC) 09/24/2015    Past Surgical History:  Procedure Laterality Date   . I & D EXTREMITY Left 09/24/2015   Procedure: IRRIGATION AND DEBRIDEMENT LEFT FOREARM ABSCESS;  Surgeon: Dairl PonderMatthew Weingold, MD;  Location: [facility] OR;  Service: Orthopedics;  Laterality: Left;  . INCISION AND DRAINAGE ABSCESS N/A 09/24/2015   Procedure: INCISION AND DRAINAGE BACK  ABSCESS;  Surgeon: Axel FillerArmando Ramirez, MD;  Location: [facility] OR;  Service: General;  Laterality: N/A;  . IRRIGATION AND DEBRIDEMENT ABSCESS N/A 09/28/2015   Procedure: IRRIGATION AND DEBRIDEMENT ABSCESS;  Surgeon: Jimmye NormanJames Wyatt, MD;  Location: [facility] OR;  Service: General;  Laterality: N/A;     OB History    Gravida  1   Para      Term      Preterm      AB      Living        SAB      IAB      Ectopic      Multiple      Live Births              History reviewed. No pertinent family history.  Social History   Tobacco Use  . Smoking status: Current Some Day Smoker    Packs/day: 0.50    Types: Cigarettes  . Smokeless tobacco: Never Used  Vaping Use  . Vaping Use: Never used  Substance Use Topics  . Alcohol use: No  . Drug use: Not Currently    Types: Heroin    Home Medications Prior to Admission medications   Medication Sig Start Date End Date  Taking? Authorizing Provider  doxycycline (VIBRAMYCIN) 100 MG capsule Take 1 capsule (100 mg total) by mouth 2 (two) times daily. 03/03/19   Lawyer, Cristal Deer, PA-C  metroNIDAZOLE (FLAGYL) 500 MG tablet Take 4 tablets (2,000 mg total) by mouth once for 1 dose. 01/26/20 01/26/20  Gerrit Heck, CNM  naproxen (NAPROSYN) 500 MG tablet Take 1 tablet (500 mg total) by mouth 2 (two) times daily with a meal. 10/02/18   Palumbo, April, MD    Allergies    Penicillins  Review of Systems   Review of Systems  Constitutional: Positive for chills, fatigue and fever. Negative for activity change.  HENT: Negative for sneezing.   Eyes: Negative for visual disturbance.  Respiratory: Positive for cough. Negative for shortness of breath.   Cardiovascular: Negative for  chest pain.  Gastrointestinal: Positive for abdominal pain. Negative for diarrhea, nausea and vomiting.  Genitourinary: Positive for difficulty urinating, dysuria, menstrual problem and vaginal discharge. Negative for hematuria, urgency and vaginal bleeding.  Musculoskeletal: Negative for back pain.  Skin: Negative for rash.  Allergic/Immunologic: Negative for immunocompromised state.  Neurological: Negative for weakness, numbness and headaches.  Psychiatric/Behavioral: Negative for confusion.    Physical Exam Updated Vital Signs BP (!) 109/59 (BP Location: Right Arm)   Pulse 96   Temp 98.5 F (36.9 C) (Oral)   Resp 20   Ht 5\' 6"  (1.676 m)   Wt 54.4 kg   LMP 11/19/2019   SpO2 99%   BMI 19.37 kg/m   Physical Exam Vitals and nursing note reviewed.  Constitutional:      General: She is not in acute distress.    Comments: Nontoxic-appearing  HENT:     Head: Normocephalic.  Eyes:     Conjunctiva/sclera: Conjunctivae normal.  Cardiovascular:     Rate and Rhythm: Normal rate and regular rhythm.     Heart sounds: No murmur heard. No friction rub. No gallop.   Pulmonary:     Effort: Pulmonary effort is normal. No respiratory distress.     Breath sounds: No stridor. No wheezing, rhonchi or rales.  Chest:     Chest wall: No tenderness.  Abdominal:     General: There is no distension.     Palpations: Abdomen is soft.  Musculoskeletal:     Cervical back: Neck supple.  Skin:    General: Skin is warm.     Coloration: Skin is not jaundiced or pale.     Findings: No rash.  Neurological:     Mental Status: She is alert.  Psychiatric:        Behavior: Behavior normal.     ED Results / Procedures / Treatments   Labs (all labs ordered are listed, but only abnormal results are displayed) Labs Reviewed  RESP PANEL BY RT-PCR (FLU A&B, COVID) ARPGX2 - Abnormal; Notable for the following components:      Result Value   SARS Coronavirus 2 by RT PCR POSITIVE (*)    All other  components within normal limits  WET PREP, GENITAL - Abnormal; Notable for the following components:   Trich, Wet Prep PRESENT (*)    WBC, Wet Prep HPF POC MANY (*)    All other components within normal limits  CBC WITH DIFFERENTIAL/PLATELET - Abnormal; Notable for the following components:   Hemoglobin 10.3 (*)    HCT 33.4 (*)    MCV 79.7 (*)    MCH 24.6 (*)    Lymphs Abs 0.6 (*)    All other components within normal limits  COMPREHENSIVE METABOLIC PANEL - Abnormal; Notable for the following components:   Sodium 129 (*)    Chloride 97 (*)    CO2 21 (*)    All other components within normal limits  URINALYSIS, ROUTINE W REFLEX MICROSCOPIC - Abnormal; Notable for the following components:   Color, Urine AMBER (*)    APPearance CLOUDY (*)    Hgb urine dipstick SMALL (*)    Ketones, ur 5 (*)    Leukocytes,Ua SMALL (*)    Bacteria, UA FEW (*)    Non Squamous Epithelial 0-5 (*)    All other components within normal limits  HCG, QUANTITATIVE, PREGNANCY - Abnormal; Notable for the following components:   hCG, Beta Francene Finders [phone] (*)    All other components within normal limits  I-STAT BETA HCG BLOOD, ED ([facility], [facility], [facility] ONLY) - Abnormal; Notable for the following components:   I-stat hCG, quantitative >2,000.0 (*)    All other components within normal limits  URINE CULTURE  GC/CHLAMYDIA PROBE AMP ([place]) NOT AT [facility]    EKG None  Radiology US OB Comp Less 14 Wks  Result Date: 01/26/2020 CLINICAL DATA:  Initial evaluation for acute pain, early pregnancy. EXAM: TWIN OBSTETRICAL ULTRASOUND <14 WKS COMPARISON:  None. None available. FINDINGS: Number of IUPs:  2 Chorionicity/Amnionicity:  Monochorionic, diamniotic. TWIN 1 Yolk sac:  Present Embryo:  Present Cardiac Activity: Present Heart Rate: 173 bpm CRL: 16.3 mm   8 w 0 d                  [place] EDC: 09/06/2020 TWIN 2 Yolk sac:  Present Embryo:  Present Cardiac Activity: Present Heart Rate: 185 bpm CRL: 16.8 mm   8 w 0 d                   [place] EDC: 09/06/2020 Subchorionic hemorrhage:  None visualized. Maternal uterus/adnexae: Ovaries not well seen bilaterally. No adnexal mass. No free fluid. IMPRESSION: 1. Viable twin pregnancy as detailed above, estimated gestational age [redacted] weeks and 0 days, with ultrasound EDC of 09/06/2020. No complication. 2. No other acute maternal uterine or adnexal abnormality. Electronically Signed   By: Rise Mu M.D.   On: 01/26/2020 02:48   US OB Comp AddL Gest Less 14 Wks  Result Date: 01/26/2020 CLINICAL DATA:  Initial evaluation for acute pain, early pregnancy. EXAM: TWIN OBSTETRICAL ULTRASOUND <14 WKS COMPARISON:  None. None available. FINDINGS: Number of IUPs:  2 Chorionicity/Amnionicity:  Monochorionic, diamniotic. TWIN 1 Yolk sac:  Present Embryo:  Present Cardiac Activity: Present Heart Rate: 173 bpm CRL: 16.3 mm   8 w 0 d                  [place] EDC: 09/06/2020 TWIN 2 Yolk sac:  Present Embryo:  Present Cardiac Activity: Present Heart Rate: 185 bpm CRL: 16.8 mm   8 w 0 d                  [place] EDC: 09/06/2020 Subchorionic hemorrhage:  None visualized. Maternal uterus/adnexae: Ovaries not well seen bilaterally. No adnexal mass. No free fluid. IMPRESSION: 1. Viable twin pregnancy as detailed above, estimated gestational age [redacted] weeks and 0 days, with ultrasound EDC of 09/06/2020. No complication. 2. No other acute maternal uterine or adnexal abnormality. Electronically Signed   By: Rise Mu M.D.   On: 01/26/2020 02:48   DG Chest Portable 1 View  Result Date: 01/25/2020 CLINICAL DATA:  Shortness of breath, fever  EXAM: PORTABLE CHEST 1 VIEW COMPARISON:  CT 09/24/2015, radiograph 09/24/2015 FINDINGS: Low volumes and atelectatic change without focal consolidative opacity. No pneumothorax. No effusion. Mild airways thickening is present. Pulmonary vascularity is normally distributed. Slight asymmetric elevation of left hemidiaphragm with air distention of the splenic flexure, nonspecific.  No subdiaphragmatic free air. The cardiomediastinal contours are unremarkable. Chronic dextrocurvature of the thoracic spine with chest wall deformity. No acute osseous or soft tissue abnormality. IMPRESSION: 1. Low volumes and atelectasis without focal consolidative opacity. 2. Mild airways thickening may reflect bronchitis or reactive airways disease. 3. Mild air distention of the splenic flexure, nonspecific. Correlate for abdominal symptoms. Electronically Signed   By: Kreg Shropshire M.D.   On: 01/25/2020 23:29    Procedures Procedures (including critical care time)  Medications Ordered in ED Medications  metroNIDAZOLE (FLAGYL) tablet 2,000 mg (2,000 mg Oral Given 01/26/20 0239)  ondansetron (ZOFRAN-ODT) disintegrating tablet 8 mg (8 mg Oral Given 01/26/20 0247)    ED Course  I have reviewed the triage vital signs and the nursing notes.  Pertinent labs & imaging results that were available during my care of the patient were reviewed by me and considered in my medical decision making (see chart for details).    MDM Rules/Calculators/A&P                          24 year old female with a history of IV heroin use, MRSA thoracic spinal abscess, vertebral osteomyelitis, pathologic C6 fracture accompanied by police for medical clearance to jail.  The patient reports complaints of subjective fever, chills, generalized myalgias, fatigue, abdominal pain, vaginal discharge, cough, and shortness of breath.  Her father tested positive for COVID-19 3 days ago.  Mild tachycardia.  Oral temp is 98.7.  No hypoxia or tachypnea.  Labs and imaging have been independently reviewed and interpreted by me.  I-STAT hCG is greater than 2000.  No leukocytosis.  She is anemic with a hemoglobin of 10.3, improved from previous.  Metabolic panel is pending.  COVID-19 test is pending.  Given that the patient had a positive pregnancy test and has had no prenatal care and is endorsing abdominal pain and vaginal  discharge, I spoke with Hilda Lias, MAU APP, who will accept the patient for transfer to the MAU.  Please see the MAU note for further work-up and medical decision making.  She is hemodynamically stable and in no acute distress at time of transfer.   Final Clinical Impression(s) / ED Diagnoses Final diagnoses:  Monochorionic diamniotic twin gestation in first trimester  Trichomoniasis  [redacted] weeks gestation of pregnancy  Heroin abuse (HCC)    Rx / DC Orders ED Discharge Orders         Ordered    metroNIDAZOLE (FLAGYL) 500 MG tablet   Once        01/26/20 0257    Discharge patient        01/26/20 0257           Barkley Boards, PA-C 01/26/20 0448    Gilda Crease, MD 01/26/20 [phone]

## 2020-01-27 ENCOUNTER — Telehealth: Payer: Self-pay | Admitting: *Deleted

## 2020-01-27 LAB — URINE CULTURE: Culture: >=100000 — AB

## 2020-01-27 NOTE — Telephone Encounter (Signed)
Pt called regarding which pharmacy Rx was e-scribed to.  RNCM reviewed chart to access After Visit Summary and found that Rx was not written.  Advised pt that I would contact EDP for clarification .

## 2020-01-28 ENCOUNTER — Telehealth: Payer: Self-pay | Admitting: Women's Health

## 2020-01-28 DIAGNOSIS — O2341 Unspecified infection of urinary tract in pregnancy, first trimester: Secondary | ICD-10-CM

## 2020-01-28 MED ORDER — CEFADROXIL 500 MG PO CAPS: 500.0000 mg | ORAL_CAPSULE | Freq: Two times a day (BID) | ORAL | 0 refills | Status: AC

## 2020-01-28 NOTE — Telephone Encounter (Signed)
Attempt #1 to call patient regarding +UTI. No answer, HIPAA compliant voicemail left letting patient know to check MyChart. Sent patient MyChart message and ABX sent to pharmacy. Patient chart states allergy to PCN. Patient has used Keflex and Ceftriaxone in the past according to med record in chart. Duricef sent for treatment of UTI.  [name], Odie Sera, NP  8:20 AM 01/28/2020

## 2020-03-07 ENCOUNTER — Encounter (HOSPITAL_BASED_OUTPATIENT_CLINIC_OR_DEPARTMENT_OTHER): Payer: Self-pay

## 2020-03-07 ENCOUNTER — Other Ambulatory Visit: Payer: Self-pay

## 2020-03-07 ENCOUNTER — Emergency Department (HOSPITAL_BASED_OUTPATIENT_CLINIC_OR_DEPARTMENT_OTHER)
Admission: EM | Admit: 2020-03-07 | Discharge: 2020-03-07 | Disposition: A | Discharge status: Home / Self Care | Payer: Medicaid Other | Attending: Emergency Medicine | Admitting: Emergency Medicine

## 2020-03-07 DIAGNOSIS — Z5321 Procedure and treatment not carried out due to patient leaving prior to being seen by health care provider: Secondary | ICD-10-CM | POA: Insufficient documentation

## 2020-03-07 DIAGNOSIS — Z3A10 10 weeks gestation of pregnancy: Secondary | ICD-10-CM | POA: Insufficient documentation

## 2020-03-07 DIAGNOSIS — O30041 Twin pregnancy, dichorionic/diamniotic, first trimester: Secondary | ICD-10-CM | POA: Insufficient documentation

## 2020-03-07 DIAGNOSIS — O209 Hemorrhage in early pregnancy, unspecified: Secondary | ICD-10-CM | POA: Insufficient documentation

## 2020-03-07 NOTE — ED Triage Notes (Addendum)
Pt c/o vaginal bleeding x 4 hours-states she is 10-[redacted] weeks pregnant with twins-pt NAD-to triage in w/c-pt falling asleep during triage-when asked why she is so sleepy pt admits to using heroin ~12pm today

## 2020-04-25 ENCOUNTER — Emergency Department (HOSPITAL_COMMUNITY)
Admission: EM | Admit: 2020-04-25 | Discharge: 2020-04-25 | Discharge status: Against Medical Advice | Payer: Medicaid Other | Attending: Emergency Medicine | Admitting: Emergency Medicine

## 2020-04-25 ENCOUNTER — Emergency Department (HOSPITAL_COMMUNITY): Payer: Medicaid Other

## 2020-04-25 ENCOUNTER — Encounter (HOSPITAL_COMMUNITY): Payer: Self-pay | Admitting: *Deleted

## 2020-04-25 ENCOUNTER — Other Ambulatory Visit: Payer: Self-pay

## 2020-04-25 DIAGNOSIS — Z8759 Personal history of other complications of pregnancy, childbirth and the puerperium: Secondary | ICD-10-CM | POA: Diagnosis not present

## 2020-04-25 DIAGNOSIS — R509 Fever, unspecified: Secondary | ICD-10-CM | POA: Diagnosis present

## 2020-04-25 DIAGNOSIS — F17210 Nicotine dependence, cigarettes, uncomplicated: Secondary | ICD-10-CM | POA: Insufficient documentation

## 2020-04-25 LAB — CBC WITH DIFFERENTIAL/PLATELET
Abs Immature Granulocytes: 0.05 10*3/uL (ref 0.00–0.07)
Basophils Absolute: 0 10*3/uL (ref 0.0–0.1)
Basophils Relative: 0 %
Eosinophils Absolute: 0.1 10*3/uL (ref 0.0–0.5)
Eosinophils Relative: 1 %
HCT: 25.3 % — ABNORMAL LOW (ref 36.0–46.0)
Hemoglobin: 7.1 g/dL — ABNORMAL LOW (ref 12.0–15.0)
Immature Granulocytes: 1 %
Lymphocytes Relative: 19 %
Lymphs Abs: 1 10*3/uL (ref 0.7–4.0)
MCH: 20.5 pg — ABNORMAL LOW (ref 26.0–34.0)
MCHC: 28.1 g/dL — ABNORMAL LOW (ref 30.0–36.0)
MCV: 73.1 fL — ABNORMAL LOW (ref 80.0–100.0)
Monocytes Absolute: 0.4 10*3/uL (ref 0.1–1.0)
Monocytes Relative: 8 %
Neutro Abs: 3.6 10*3/uL (ref 1.7–7.7)
Neutrophils Relative %: 71 %
Platelets: 290 10*3/uL (ref 150–400)
RBC: 3.46 MIL/uL — ABNORMAL LOW (ref 3.87–5.11)
RDW: 18.1 % — ABNORMAL HIGH (ref 11.5–15.5)
WBC: 5.2 10*3/uL (ref 4.0–10.5)
nRBC: 0 % (ref 0.0–0.2)

## 2020-04-25 LAB — COMPREHENSIVE METABOLIC PANEL
ALT: 27 U/L (ref 0–44)
AST: 30 U/L (ref 15–41)
Albumin: 3.2 g/dL — ABNORMAL LOW (ref 3.5–5.0)
Alkaline Phosphatase: 78 U/L (ref 38–126)
Anion gap: 6 (ref 5–15)
BUN: 7 mg/dL (ref 6–20)
CO2: 26 mmol/L (ref 22–32)
Calcium: 8.6 mg/dL — ABNORMAL LOW (ref 8.9–10.3)
Chloride: 101 mmol/L (ref 98–111)
Creatinine, Ser: 0.65 mg/dL (ref 0.44–1.00)
GFR, Estimated: >60 mL/min (ref >60)
Glucose, Bld: 63 mg/dL — ABNORMAL LOW (ref 70–99)
Potassium: 4 mmol/L (ref 3.5–5.1)
Sodium: 133 mmol/L — ABNORMAL LOW (ref 135–145)
Total Bilirubin: 0.7 mg/dL (ref 0.3–1.2)
Total Protein: 8.6 g/dL — ABNORMAL HIGH (ref 6.5–8.1)

## 2020-04-25 LAB — PROTIME-INR
INR: 1 (ref 0.8–1.2)
Prothrombin Time: 13.2 seconds (ref 11.4–15.2)

## 2020-04-25 LAB — I-STAT BETA HCG BLOOD, ED (MC, WL, AP ONLY): I-stat hCG, quantitative: 7.7 m[IU]/mL — ABNORMAL HIGH (ref <5)

## 2020-04-25 LAB — LACTIC ACID, PLASMA: Lactic Acid, Venous: 1.5 mmol/L (ref 0.5–1.9)

## 2020-04-25 MED ORDER — ACETAMINOPHEN 325 MG PO TABS
650.0000 mg | ORAL_TABLET | Freq: Once | ORAL | Status: AC | PRN
  Administered 2020-04-25: 650 mg via ORAL
  Filled 2020-04-25: qty 2
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
## 2020-04-25 NOTE — ED Notes (Signed)
Patient leaving AMA, does not want to be seen due to needing to be home with children.

## 2020-04-25 NOTE — ED Notes (Signed)
Pt refusing second set of cultures.

## 2020-04-30 LAB — CULTURE, BLOOD (ROUTINE X 2)
Culture: NO GROWTH
Special Requests: ADEQUATE

## 2020-07-07 ENCOUNTER — Other Ambulatory Visit: Payer: Self-pay

## 2020-07-07 ENCOUNTER — Encounter (HOSPITAL_BASED_OUTPATIENT_CLINIC_OR_DEPARTMENT_OTHER): Payer: Self-pay | Admitting: *Deleted

## 2020-07-07 DIAGNOSIS — N938 Other specified abnormal uterine and vaginal bleeding: Secondary | ICD-10-CM | POA: Diagnosis not present

## 2020-07-07 DIAGNOSIS — N920 Excessive and frequent menstruation with regular cycle: Secondary | ICD-10-CM | POA: Diagnosis not present

## 2020-07-07 DIAGNOSIS — R531 Weakness: Secondary | ICD-10-CM | POA: Diagnosis present

## 2020-07-07 DIAGNOSIS — F1120 Opioid dependence, uncomplicated: Secondary | ICD-10-CM | POA: Diagnosis present

## 2020-07-07 DIAGNOSIS — Z20822 Contact with and (suspected) exposure to covid-19: Secondary | ICD-10-CM | POA: Diagnosis present

## 2020-07-07 DIAGNOSIS — N939 Abnormal uterine and vaginal bleeding, unspecified: Secondary | ICD-10-CM | POA: Diagnosis not present

## 2020-07-07 DIAGNOSIS — D649 Anemia, unspecified: Secondary | ICD-10-CM | POA: Diagnosis not present

## 2020-07-07 DIAGNOSIS — Z8661 Personal history of infections of the central nervous system: Secondary | ICD-10-CM | POA: Diagnosis not present

## 2020-07-07 DIAGNOSIS — D509 Iron deficiency anemia, unspecified: Principal | ICD-10-CM | POA: Diagnosis present

## 2020-07-07 DIAGNOSIS — Z880 Allergy status to penicillin: Secondary | ICD-10-CM

## 2020-07-07 DIAGNOSIS — F17210 Nicotine dependence, cigarettes, uncomplicated: Secondary | ICD-10-CM | POA: Diagnosis not present

## 2020-07-07 NOTE — ED Triage Notes (Signed)
C/o n/v , fever x 3 days

## 2020-07-08 ENCOUNTER — Encounter (HOSPITAL_COMMUNITY): Payer: Self-pay

## 2020-07-08 ENCOUNTER — Observation Stay (HOSPITAL_BASED_OUTPATIENT_CLINIC_OR_DEPARTMENT_OTHER)
Admission: EM | Admit: 2020-07-08 | Discharge: 2020-07-08 | DRG: 812 | Discharge status: Against Medical Advice | Payer: Medicaid Other | Attending: Internal Medicine | Admitting: Internal Medicine

## 2020-07-08 ENCOUNTER — Inpatient Hospital Stay (HOSPITAL_COMMUNITY): Payer: Medicaid Other

## 2020-07-08 DIAGNOSIS — N920 Excessive and frequent menstruation with regular cycle: Secondary | ICD-10-CM | POA: Diagnosis not present

## 2020-07-08 DIAGNOSIS — N938 Other specified abnormal uterine and vaginal bleeding: Secondary | ICD-10-CM | POA: Diagnosis not present

## 2020-07-08 DIAGNOSIS — D649 Anemia, unspecified: Secondary | ICD-10-CM | POA: Diagnosis present

## 2020-07-08 DIAGNOSIS — F17200 Nicotine dependence, unspecified, uncomplicated: Secondary | ICD-10-CM

## 2020-07-08 DIAGNOSIS — F1110 Opioid abuse, uncomplicated: Secondary | ICD-10-CM | POA: Diagnosis not present

## 2020-07-08 DIAGNOSIS — Z880 Allergy status to penicillin: Secondary | ICD-10-CM | POA: Diagnosis not present

## 2020-07-08 DIAGNOSIS — R531 Weakness: Secondary | ICD-10-CM | POA: Diagnosis present

## 2020-07-08 DIAGNOSIS — N9489 Other specified conditions associated with female genital organs and menstrual cycle: Secondary | ICD-10-CM | POA: Insufficient documentation

## 2020-07-08 DIAGNOSIS — D509 Iron deficiency anemia, unspecified: Secondary | ICD-10-CM | POA: Diagnosis not present

## 2020-07-08 DIAGNOSIS — N939 Abnormal uterine and vaginal bleeding, unspecified: Secondary | ICD-10-CM

## 2020-07-08 DIAGNOSIS — F17210 Nicotine dependence, cigarettes, uncomplicated: Secondary | ICD-10-CM | POA: Diagnosis present

## 2020-07-08 DIAGNOSIS — Z20822 Contact with and (suspected) exposure to covid-19: Secondary | ICD-10-CM | POA: Diagnosis not present

## 2020-07-08 DIAGNOSIS — F1120 Opioid dependence, uncomplicated: Secondary | ICD-10-CM | POA: Diagnosis not present

## 2020-07-08 DIAGNOSIS — Z8661 Personal history of infections of the central nervous system: Secondary | ICD-10-CM | POA: Diagnosis not present

## 2020-07-08 LAB — BASIC METABOLIC PANEL
Anion gap: 5 (ref 5–15)
BUN: 8 mg/dL (ref 6–20)
CO2: 24 mmol/L (ref 22–32)
Calcium: 7.8 mg/dL — ABNORMAL LOW (ref 8.9–10.3)
Chloride: 106 mmol/L (ref 98–111)
Creatinine, Ser: 0.64 mg/dL (ref 0.44–1.00)
GFR, Estimated: >60 mL/min (ref >60)
Glucose, Bld: 97 mg/dL (ref 70–99)
Potassium: 4.3 mmol/L (ref 3.5–5.1)
Sodium: 135 mmol/L (ref 135–145)

## 2020-07-08 LAB — COMPREHENSIVE METABOLIC PANEL
ALT: 12 U/L (ref 0–44)
AST: 27 U/L (ref 15–41)
Albumin: 2.9 g/dL — ABNORMAL LOW (ref 3.5–5.0)
Alkaline Phosphatase: 37 U/L — ABNORMAL LOW (ref 38–126)
Anion gap: 5 (ref 5–15)
BUN: 10 mg/dL (ref 6–20)
CO2: 27 mmol/L (ref 22–32)
Calcium: 7.9 mg/dL — ABNORMAL LOW (ref 8.9–10.3)
Chloride: 98 mmol/L (ref 98–111)
Creatinine, Ser: 0.79 mg/dL (ref 0.44–1.00)
GFR, Estimated: >60 mL/min (ref >60)
Glucose, Bld: 146 mg/dL — ABNORMAL HIGH (ref 70–99)
Potassium: 3.4 mmol/L — ABNORMAL LOW (ref 3.5–5.1)
Sodium: 130 mmol/L — ABNORMAL LOW (ref 135–145)
Total Bilirubin: 0.5 mg/dL (ref 0.3–1.2)
Total Protein: 6.3 g/dL — ABNORMAL LOW (ref 6.5–8.1)

## 2020-07-08 LAB — CBC
HCT: 12 % — ABNORMAL LOW (ref 36.0–46.0)
HCT: 27 % — ABNORMAL LOW (ref 36.0–46.0)
Hemoglobin: 3.1 g/dL — CL (ref 12.0–15.0)
Hemoglobin: 7.9 g/dL — ABNORMAL LOW (ref 12.0–15.0)
MCH: 16.8 pg — ABNORMAL LOW (ref 26.0–34.0)
MCH: 21 pg — ABNORMAL LOW (ref 26.0–34.0)
MCHC: 25.8 g/dL — ABNORMAL LOW (ref 30.0–36.0)
MCHC: 29.3 g/dL — ABNORMAL LOW (ref 30.0–36.0)
MCV: 65.2 fL — ABNORMAL LOW (ref 80.0–100.0)
MCV: 71.8 fL — ABNORMAL LOW (ref 80.0–100.0)
Platelets: 306 10*3/uL (ref 150–400)
Platelets: 346 10*3/uL (ref 150–400)
RBC: 1.84 MIL/uL — ABNORMAL LOW (ref 3.87–5.11)
RBC: 3.76 MIL/uL — ABNORMAL LOW (ref 3.87–5.11)
RDW: 17.8 % — ABNORMAL HIGH (ref 11.5–15.5)
RDW: 22 % — ABNORMAL HIGH (ref 11.5–15.5)
WBC: 5.4 10*3/uL (ref 4.0–10.5)
WBC: 4.6 10*3/uL (ref 4.0–10.5)
nRBC: 0 % (ref 0.0–0.2)
nRBC: 0 % (ref 0.0–0.2)

## 2020-07-08 LAB — CBC WITH DIFFERENTIAL/PLATELET
Abs Immature Granulocytes: 0.04 10*3/uL (ref 0.00–0.07)
Basophils Absolute: 0 10*3/uL (ref 0.0–0.1)
Basophils Relative: 0 %
Eosinophils Absolute: 0 10*3/uL (ref 0.0–0.5)
Eosinophils Relative: 0 %
HCT: 11.7 % — ABNORMAL LOW (ref 36.0–46.0)
Hemoglobin: 3.3 g/dL — CL (ref 12.0–15.0)
Immature Granulocytes: 1 %
Lymphocytes Relative: 24 %
Lymphs Abs: 1.7 10*3/uL (ref 0.7–4.0)
MCH: 17.7 pg — ABNORMAL LOW (ref 26.0–34.0)
MCHC: 28.2 g/dL — ABNORMAL LOW (ref 30.0–36.0)
MCV: 62.9 fL — ABNORMAL LOW (ref 80.0–100.0)
Monocytes Absolute: 0.6 10*3/uL (ref 0.1–1.0)
Monocytes Relative: 9 %
Neutro Abs: 4.6 10*3/uL (ref 1.7–7.7)
Neutrophils Relative %: 66 %
Platelets: 308 10*3/uL (ref 150–400)
RBC: 1.86 MIL/uL — ABNORMAL LOW (ref 3.87–5.11)
RDW: 17.6 % — ABNORMAL HIGH (ref 11.5–15.5)
Smear Review: ADEQUATE
WBC: 7 10*3/uL (ref 4.0–10.5)
nRBC: 0 % (ref 0.0–0.2)

## 2020-07-08 LAB — RESP PANEL BY RT-PCR (FLU A&B, COVID) ARPGX2
Influenza A by PCR: NEGATIVE
Influenza B by PCR: NEGATIVE
SARS Coronavirus 2 by RT PCR: NEGATIVE

## 2020-07-08 LAB — LIPASE, BLOOD: Lipase: 24 U/L (ref 11–51)

## 2020-07-08 LAB — PREPARE RBC (CROSSMATCH)

## 2020-07-08 LAB — HCG, QUANTITATIVE, PREGNANCY: hCG, Beta Chain, Quant, S: 1 m[IU]/mL (ref <5)

## 2020-07-08 LAB — ABO/RH: ABO/RH(D): O POS

## 2020-07-08 LAB — HIV ANTIBODY (ROUTINE TESTING W REFLEX): HIV Screen 4th Generation wRfx: NONREACTIVE

## 2020-07-08 MED ORDER — ONDANSETRON HCL 4 MG/2ML IJ SOLN
4.0000 mg | Freq: Four times a day (QID) | INTRAMUSCULAR | Status: DC | PRN
  Administered 2020-07-08: 4 mg via INTRAVENOUS
  Filled 2020-07-08: qty 2

## 2020-07-08 MED ORDER — ACETAMINOPHEN 650 MG RE SUPP: 650.0000 mg | Freq: Four times a day (QID) | RECTAL | Status: DC | PRN

## 2020-07-08 MED ORDER — CLONIDINE HCL 0.1 MG PO TABS: 0.1000 mg | ORAL_TABLET | ORAL | Status: DC

## 2020-07-08 MED ORDER — NICOTINE 14 MG/24HR TD PT24
14.0000 mg | MEDICATED_PATCH | Freq: Every day | TRANSDERMAL | Status: DC
  Administered 2020-07-08: 14 mg via TRANSDERMAL
  Filled 2020-07-08: qty 1

## 2020-07-08 MED ORDER — CLONIDINE HCL 0.1 MG PO TABS
0.1000 mg | ORAL_TABLET | Freq: Four times a day (QID) | ORAL | Status: DC
  Administered 2020-07-08: 0.1 mg via ORAL
  Filled 2020-07-08: qty 1

## 2020-07-08 MED ORDER — KETOROLAC TROMETHAMINE 30 MG/ML IJ SOLN
30.0000 mg | Freq: Once | INTRAMUSCULAR | Status: AC
  Administered 2020-07-08: 30 mg via INTRAVENOUS
  Filled 2020-07-08: qty 1

## 2020-07-08 MED ORDER — HYDROCODONE-ACETAMINOPHEN 5-325 MG PO TABS: 1.0000 | ORAL_TABLET | ORAL | Status: DC | PRN

## 2020-07-08 MED ORDER — LACTATED RINGERS IV SOLN: INTRAVENOUS | Status: DC

## 2020-07-08 MED ORDER — NAPROXEN 250 MG PO TABS
500.0000 mg | ORAL_TABLET | Freq: Two times a day (BID) | ORAL | Status: DC | PRN
  Filled 2020-07-08: qty 2

## 2020-07-08 MED ORDER — ACETAMINOPHEN 325 MG PO TABS: 650.0000 mg | ORAL_TABLET | Freq: Four times a day (QID) | ORAL | Status: DC | PRN

## 2020-07-08 MED ORDER — HYDROXYZINE HCL 25 MG PO TABS: 25.0000 mg | ORAL_TABLET | Freq: Four times a day (QID) | ORAL | Status: DC | PRN

## 2020-07-08 MED ORDER — ONDANSETRON HCL 4 MG PO TABS: 4.0000 mg | ORAL_TABLET | Freq: Four times a day (QID) | ORAL | Status: DC | PRN

## 2020-07-08 MED ORDER — SODIUM CHLORIDE 0.9 % IV BOLUS
1000.0000 mL | Freq: Once | INTRAVENOUS | Status: AC
  Administered 2020-07-08: 1000 mL via INTRAVENOUS

## 2020-07-08 MED ORDER — TRANEXAMIC ACID 650 MG PO TABS
1300.0000 mg | ORAL_TABLET | Freq: Three times a day (TID) | ORAL | Status: DC
  Administered 2020-07-08 (×2): 1300 mg via ORAL
  Filled 2020-07-08 (×3): qty 2

## 2020-07-08 MED ORDER — CLONIDINE HCL 0.1 MG PO TABS: 0.1000 mg | ORAL_TABLET | Freq: Every day | ORAL | Status: DC

## 2020-07-08 MED ORDER — LOPERAMIDE HCL 2 MG PO CAPS: 2.0000 mg | ORAL_CAPSULE | ORAL | Status: DC | PRN

## 2020-07-08 MED ORDER — HYDRALAZINE HCL 20 MG/ML IJ SOLN: 5.0000 mg | INTRAMUSCULAR | Status: DC | PRN

## 2020-07-08 MED ORDER — POTASSIUM CHLORIDE CRYS ER 20 MEQ PO TBCR
40.0000 meq | EXTENDED_RELEASE_TABLET | Freq: Once | ORAL | Status: AC
  Administered 2020-07-08: 40 meq via ORAL
  Filled 2020-07-08: qty 2

## 2020-07-08 MED ORDER — SODIUM CHLORIDE 0.9 % IV SOLN: 10.0000 mL/h | Freq: Once | INTRAVENOUS | Status: DC

## 2020-07-08 MED ORDER — SODIUM CHLORIDE 0.9% FLUSH: 3.0000 mL | Freq: Two times a day (BID) | INTRAVENOUS | Status: DC

## 2020-07-08 MED ORDER — ONDANSETRON HCL 4 MG/2ML IJ SOLN
4.0000 mg | Freq: Once | INTRAMUSCULAR | Status: AC
  Administered 2020-07-08: 4 mg via INTRAVENOUS
  Filled 2020-07-08: qty 2

## 2020-07-08 MED ORDER — DICYCLOMINE HCL 20 MG PO TABS: 20.0000 mg | ORAL_TABLET | Freq: Four times a day (QID) | ORAL | Status: DC | PRN

## 2020-07-08 MED ORDER — METHOCARBAMOL 500 MG PO TABS: 500.0000 mg | ORAL_TABLET | Freq: Three times a day (TID) | ORAL | Status: DC | PRN

## 2020-07-08 MED ORDER — ALBUTEROL SULFATE (2.5 MG/3ML) 0.083% IN NEBU: 2.5000 mg | INHALATION_SOLUTION | RESPIRATORY_TRACT | Status: DC | PRN

## 2020-07-08 NOTE — Progress Notes (Signed)
HOSPITAL MEDICINE OVERNIGHT EVENT NOTE    Notified by nursing the patient has left AGAINST MEDICAL ADVICE.  Patient apparently was upset over the fact that she was now allowed to have visitors beyond visitation hours.  Nursing states that it was explained to the patient that she needs to remain for continued monitoring and treatment.  If patient left AGAINST MEDICAL ADVICE she would place her self at risk of morbidity or death.  Nursing reports the patient has voiced her understanding of this risk.  Patient is proceeded to fill out all appropriate paperwork.  Patient has been urged to seek medical attention seems possible.   Marinda Elk  MD [facility]

## 2020-07-08 NOTE — H&P (Addendum)
History and Physical    Kathryn Blankenship XQJ:[id number] DOB: 1995/08/03 DOA: 07/08/2020  PCP: Patient, No Pcp Per (Inactive) - Health Department Consultants:  None Patient coming from:  Home - lives with friend; NOK: Manfred Shirts, [phone]  Chief Complaint: weakness  HPI: Kathryn Blankenship is a 25 y.o. female with medical history significant of IVDA (heroin) with h/o vertebral osteo and DUB presenting with weakness.  She has been having heavy periods and has been very weak, dizzy, and nauseated.  She does not always have heavy periods.  Started menses 3-4 days ago.  She has been changing super plus pads every 10 minutes, compared to her usual which is much less.  She started feeling light-headed/dizzy 3 days ago.  She went to the HD several days ago to get tested for STIs and those were negative.  Bleeding started after that visit.  She does not use birth control.  No prior h/o blood transfusions.      ED Course:  Carryover, per Dr. Margo Aye:  25 year old female with past medical history of heroin abuse, thoracic spinal abscess with vertebral osteomyelitis(09/2015), abnormal uterine bleeding with chronic iron deficiency anemia who presents to [facility] emergency department with complaints of generalized weakness. Patient denies shortness of breath or chest pain. Patient found to have impressively low hemoglobin of 3.3. Case discussed with on-call OB/GYN Charlotta Newton) Who recommended that medicine admit and initiate blood transfusion and that medicine formally consult OB/GYN once admitted. Due to urgent need for blood transfusion patient was transferred ED to ED.  Review of Systems: As per HPI; otherwise review of systems reviewed and negative.   Ambulatory Status:  Ambulates without assistance  COVID Vaccine Status:  None  Past Medical History:  Diagnosis Date  . Drug abuse and dependence (HCC)   . Heroin abuse [facility])     Past Surgical History:  Procedure Laterality Date   . I & D EXTREMITY Left 09/24/2015   Procedure: IRRIGATION AND DEBRIDEMENT LEFT FOREARM ABSCESS;  Surgeon: Dairl Ponder, MD;  Location: [facility] OR;  Service: Orthopedics;  Laterality: Left;  . INCISION AND DRAINAGE ABSCESS N/A 09/24/2015   Procedure: INCISION AND DRAINAGE BACK  ABSCESS;  Surgeon: Axel Filler, MD;  Location: [facility] OR;  Service: General;  Laterality: N/A;  . IRRIGATION AND DEBRIDEMENT ABSCESS N/A 09/28/2015   Procedure: IRRIGATION AND DEBRIDEMENT ABSCESS;  Surgeon: Jimmye Norman, MD;  Location: [facility] OR;  Service: General;  Laterality: N/A;    Social History   Socioeconomic History  . Marital status: Single    Spouse name: Not on file  . Number of children: Not on file  . Years of education: Not on file  . Highest education level: Not on file  Occupational History  . Occupation: unemployed  Tobacco Use  . Smoking status: Current Every Day Smoker    Packs/day: 0.50    Types: Cigarettes  . Smokeless tobacco: Never Used  . Tobacco comment: requests patch  Vaping Use  . Vaping Use: Never used  Substance and Sexual Activity  . Alcohol use: No  . Drug use: Yes    Types: Heroin, IV, Cocaine    Comment: heroin, daily use; denies use of cocaine currently  . Sexual activity: Not on file  Other Topics Concern  . Not on file  Social History Narrative  . Not on file   Social Determinants of Health   Financial Resource Strain: Not on file  Food Insecurity: Not on file  Transportation Needs: Not on file  Physical Activity: Not on file  Stress: Not on file  Social Connections: Not on file  Intimate Partner Violence: Not on file    Allergies  Allergen Reactions  . Penicillins Hives    History reviewed. No pertinent family history.  Prior to Admission medications   Not on File    Physical Exam: Vitals:   07/08/20 1315 07/08/20 1430 07/08/20 1639 07/08/20 1758  BP: (!) 90/50 (!) 92/47 (!) 99/51 107/66  Pulse: 86 76 80 93  Resp: 14 16 16 18   Temp: 98.4 F (36.9  C) 98.2 F (36.8 C) 97.6 F (36.4 C) (!) 97.5 F (36.4 C)  TempSrc: Oral Oral Oral Oral  SpO2: 100% 100% 100% 100%  Weight:      Height:         . General:  Appears frail, chronically ill . Eyes:  PERRL, EOMI, normal lids, iris . ENT:  grossly normal hearing, lips & tongue, mmm; very poor dentition . Neck:  no LAD, masses or thyromegaly . Cardiovascular:  RRR, no m/r/g. No LE edema.  Respiratory:   CTA bilaterally with no wheezes/rales/rhonchi.  Normal respiratory effort. . Abdomen:  soft, NT, ND . Skin:  Track marks on B feet . Musculoskeletal:  grossly normal tone BUE/BLE, good ROM, no bony abnormality . Psychiatric:  flat mood and affect, speech fluent and appropriate, AOx3 . Neurologic:  CN 2-12 grossly intact, moves all extremities in coordinated fashion    Radiological Exams on Admission: Independently reviewed - see discussion in A/P where applicable  Marland Kitchen PELVIC COMPLETE WITH TRANSVAGINAL  Result Date: 07/08/2020 CLINICAL DATA:  Dysfunctional uterine bleeding EXAM: TRANSABDOMINAL AND TRANSVAGINAL ULTRASOUND OF PELVIS TECHNIQUE: Both transabdominal and transvaginal ultrasound examinations of the pelvis were performed. Transabdominal technique was performed for global imaging of the pelvis including uterus, ovaries, adnexal regions, and pelvic cul-de-sac. It was necessary to proceed with endovaginal exam following the transabdominal exam to visualize the uterus, endometrium, ovaries, and adnexa. COMPARISON:  None FINDINGS: Uterus Measurements: 8.9 x 4.7 x 5.2 cm = volume: 114 mL. No fibroids or other mass visualized. Endometrium Thickness: 11 mm. There is a focal, masslike lesion within the endometrial cavity measuring 2.5 x 1.5 x 1.1 cm, with some internal Doppler flow. Right ovary Measurements: 3.8 x 3.3 x 3.2 cm = volume: 21 mL. Normal appearance/no adnexal mass. Multiple small follicles. Left ovary Measurements: 3.3 x 1.9 x 2.3 cm = volume: 6 mL. Normal appearance/no adnexal  mass. Multiple small follicles. Other findings No abnormal free fluid. IMPRESSION: 1. There is a focal, masslike lesion within the endometrial cavity measuring 2.5 x 1.5 x 1.1 cm, with some internal Doppler flow. This may represent an endometrial polyp or submucosal fibroid. 2. No other ultrasound abnormality of the pelvis. Electronically Signed   By: 09/07/2020 M.D.   On: 07/08/2020 11:20    EKG: not done   Labs on Admission: I have personally reviewed the available labs and imaging studies at the time of the admission.  Pertinent labs:   Na++ 130 K+ 3.4 Glucose 146 Albumin 2.9 WBC 7.0 Hgb 3.3 MCV 62.9, RDW 17.6 HCG 7.7 COVID/flu negative   Assessment/Plan Principal Problem:   Severe anemia Active Problems:   Heroin abuse (HCC)   Cigarette smoker   Abnormal uterine bleeding (AUB)   Symptomatic anemia -Patient without prior h/o DUB or anemia requiiring transfusion presenting with fatigue, SOB -Hgb 4.0; MCV 62.9, RDW 17.6 -Iron deficiency anemia, currently transfusing but she is likely to also benefit  from IV iron infusion prior to d/c -Will admit to telemetry -Transfuse 2 units PRBC to start and recheck Hgb afterwards.  Given her very low starting Hgb, she is likely to require more units.   -Patient counseled about short- and long-term risks associated with transfusion and consents to receive blood products.  DUB -She reports an extremely heavy period, currently improving and on day 4 -She denies typically heavy periods and does note miscarriage that occurred 4 months ago -She was seen by [facility] last week and had STI testing -Given TXA as per GYN -GYN consulted -Pelvic US ordered  IVDA/heroin dependence -Long-standing heroin dependence -She reports a desire to quit and recognizes that ongoing use is life-threatening -However, she does not desire initiation of Suboxone while in the hospital -Will monitor on COWS protocol (5-12 mild symptoms; 13-24 moderate symptoms;  25-36 moderately severe symptoms; >36 severe symptoms) -TOC team consult for substance abuse education/support -prn orders from the Clonidine withdrawal order set were also ordered.  Tobacco dependence -Encourage cessation.   -This was discussed with the patient and should be reviewed on an ongoing basis.   -Patch ordered at patient request.    Note: This patient has been tested and is negative for the novel coronavirus COVID-19. The patient has NOT been vaccinated against COVID-19.   Level of care: Telemetry Medical DVT prophylaxis:  SCDs Code Status:  Full  Family Communication: Friend was present throughout evaluation Disposition Plan:  The patient is from: home  Anticipated d/c is to: home without [facility] services   Anticipated d/c date will depend on clinical response to treatment, likely 2-3 days  Patient is currently: acutely ill Consults called: GYN; TOC team; nutrition Admission status:  Admit - It is my clinical opinion that admission to INPATIENT is reasonable and necessary because of the expectation that this patient will require hospital care that crosses at least 2 midnights to treat this condition based on the medical complexity of the problems presented.  Given the aforementioned information, the predictability of an adverse outcome is felt to be significant.    Jonah Blue MD [facility]   How to contact the [facility] Attending or Consulting provider 7A - 7P or covering provider during after hours 7P -7A, for this patient?  1. Check the care team in [facility] and look for a) attending/consulting [facility] provider listed and b) the [facility] team listed 2. Log into [email or web address] and use [place]'s universal password to access. If you do not have the password, please contact the hospital operator. 3. Locate the [facility] provider you are looking for under [facility] and page to a number that you can be directly reached. 4. If you still have difficulty reaching the provider,  please page the [facility] (Director on Call) for the Hospitalists listed on amion for assistance.   07/08/2020, 6:37 PM

## 2020-07-08 NOTE — ED Notes (Signed)
IV team at bedside 

## 2020-07-08 NOTE — ED Provider Notes (Signed)
[facility] EMERGENCY DEPARTMENT Provider Note   CSN: [id number] Arrival date & time: 07/07/20  2250     History Chief Complaint  Patient presents with  . Vomiting    Kathryn Blankenship is a 25 y.o. female.  Patient is a 25 year old female with past medical history of IV drug abuse, hepatitis C, anemia.  Patient presenting today for evaluation of generalized weakness, nausea, and vomiting.  This has worsened over the past several days.  She was seen [facility] and underwent STD screening and was told these were all negative.   The history is provided by the patient.       Past Medical History:  Diagnosis Date  . Drug abuse and dependence (HCC)   . Heroin abuse [facility])     Patient Active Problem List   Diagnosis Date Noted  . H/O drug dependence/abuse (HCC) 11/15/2015  . 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 Disruption of tissue around surgical drain 10/24/2015  . Hepatitis C antibody test positive 09/26/2015  . 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 Abscess of left arm 09/25/2015  . Normocytic anemia 09/25/2015  . Protein-calorie malnutrition, severe (HCC) 09/25/2015  . Cigarette smoker 09/25/2015  . 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 Heroin abuse (HCC) 09/24/2015  . Sepsis (HCC) 09/24/2015    Past Surgical History:  Procedure Laterality Date  . I & D EXTREMITY Left 09/24/2015   Procedure: IRRIGATION AND DEBRIDEMENT LEFT FOREARM ABSCESS;  Surgeon: Dairl Ponder, MD;  Location: [facility] OR;  Service: Orthopedics;  Laterality: Left;  . INCISION AND DRAINAGE ABSCESS N/A 09/24/2015   Procedure: INCISION AND DRAINAGE BACK  ABSCESS;  Surgeon: Axel Filler, MD;  Location: [facility] OR;  Service: General;  Laterality: N/A;  . IRRIGATION AND DEBRIDEMENT ABSCESS N/A 09/28/2015   Procedure: IRRIGATION AND DEBRIDEMENT ABSCESS;  Surgeon: Jimmye Norman, MD;  Location: [facility] OR;  Service: General;  Laterality: N/A;     OB History    Gravida  1   Para       Term      Preterm      AB      Living        SAB      IAB      Ectopic      Multiple      Live Births              No family history on file.  Social History   Tobacco Use  . Smoking status: Current Some Day Smoker    Packs/day: 0.50    Types: Cigarettes  . Smokeless tobacco: Never Used  Vaping Use  . Vaping Use: Never used  Substance Use Topics  . Alcohol use: No  . Drug use: Yes    Types: Heroin, IV, Cocaine    Comment: heroin    Home Medications Prior to Admission medications   Medication Sig Start Date End Date Taking? Authorizing Provider  doxycycline (VIBRAMYCIN) 100 MG capsule Take 1 capsule (100 mg total) by mouth 2 (two) times daily. 03/03/19   Lawyer, Cristal Deer, PA-C  naproxen (NAPROSYN) 500 MG tablet Take 1 tablet (500 mg total) by mouth 2 (two) times daily with a meal. 10/02/18   Palumbo, April, MD    Allergies    Penicillins  Review of Systems   Review of Systems  All other systems reviewed and are negative.   Physical Exam Updated Vital Signs BP 114/70   Pulse (!) 108  Temp 98.7 F (37.1 C) (Oral)   Resp 16   Ht 5\' 3"  (1.6 m)   Wt 51.3 kg   LMP 07/07/2020   SpO2 94%   Breastfeeding Unknown   BMI 20.02 kg/m   Physical Exam Vitals and nursing note reviewed.  Constitutional:      General: She is not in acute distress.    Appearance: She is well-developed. She is not diaphoretic.     Comments: Patient is awake and alert.  She is pale appearing and very thin.  HENT:     Head: Normocephalic and atraumatic.  Cardiovascular:     Rate and Rhythm: Normal rate and regular rhythm.     Heart sounds: No murmur heard. No friction rub. No gallop.   Pulmonary:     Effort: Pulmonary effort is normal. No respiratory distress.     Breath sounds: Normal breath sounds. No wheezing.  Abdominal:     General: Bowel sounds are normal. There is no distension.     Palpations: Abdomen is soft.     Tenderness: There is no abdominal  tenderness.  Musculoskeletal:        General: Normal range of motion.     Cervical back: Normal range of motion and neck supple.  Skin:    General: Skin is warm and dry.  Neurological:     Mental Status: She is alert and oriented to person, place, and time.     ED Results / Procedures / Treatments   Labs (all labs ordered are listed, but only abnormal results are displayed) Labs Reviewed  RESP PANEL BY RT-PCR (FLU A&B, COVID) ARPGX2  PREGNANCY, URINE  URINALYSIS, ROUTINE W REFLEX MICROSCOPIC  COMPREHENSIVE METABOLIC PANEL  LIPASE, BLOOD  CBC WITH DIFFERENTIAL/PLATELET  RAPID URINE DRUG SCREEN, HOSP PERFORMED    EKG None  Radiology No results found.  Procedures Procedures   Medications Ordered in ED Medications  sodium chloride 0.9 % bolus 1,000 mL (has no administration in time range)  ondansetron (ZOFRAN) injection 4 mg (has no administration in time range)  ketorolac (TORADOL) 30 MG/ML injection 30 mg (has no administration in time range)    ED Course  I have reviewed the triage vital signs and the nursing notes.  Pertinent labs & imaging results that were available during my care of the patient were reviewed by me and considered in my medical decision making (see chart for details).    MDM Rules/Calculators/A&P  Patient presenting here with complaints of weakness, nausea, and vomiting and generalized malaise worsening over the past several days.  Patient did have a miscarriage back in February and has had intermittent vaginal bleeding since.    Patient's hemoglobin today is 3.3, but vital signs are otherwise stable.  She is quite pale appearing and in need of transfusion.  I discussed care with Dr. March from GYN as well as Dr. Charlotta Newton from the hospitalist service.  Patient to be transported ER to ER so she can receive crossmatched blood in a timely fashion.  I have spoken with Dr. Leafy Half at [facility] who agrees to accept in transfer.  CRITICAL  CARE Performed by: [facility] Total critical care time: 45 minutes Critical care time was exclusive of separately billable procedures and treating other patients. Critical care was necessary to treat or prevent imminent or life-threatening deterioration. Critical care was time spent personally by me on the following activities: development of treatment plan with patient and/or surrogate as well as nursing, discussions with consultants, evaluation of  patient's response to treatment, examination of patient, obtaining history from patient or surrogate, ordering and performing treatments and interventions, ordering and review of laboratory studies, ordering and review of radiographic studies, pulse oximetry and re-evaluation of patient's condition.   Final Clinical Impression(s) / ED Diagnoses Final diagnoses:  None    Rx / DC Orders ED Discharge Orders    None       Geoffery Lyons, MD 07/08/20 [phone]

## 2020-07-08 NOTE — ED Notes (Signed)
Pt transported to [facility]

## 2020-07-08 NOTE — ED Notes (Signed)
Report given to Shanda Bumps, Charity fundraiser, charge nurse at [facility]

## 2020-07-08 NOTE — ED Notes (Signed)
hospitalist at bedside

## 2020-07-08 NOTE — ED Notes (Signed)
Breakfast tray ordered 

## 2020-07-08 NOTE — Consult Note (Signed)
Gynecology Consult Note  Date of Consult: 07/08/2020   Requesting Provider: Internal Medicine ([facility])  Primary OBGYN: None Primary Care Provider: Patient, No Pcp Per (Inactive)  Reason for Consult: Anemia, AUB  History of Present Illness: Kathryn Blankenship is a 25 y.o. G1P0010 HD#1 (Patient's last menstrual period was 07/07/2020.), with the above CC. PMHx is significant for IV drug abuse, hep C, h/o paraspinal abscess, tobacco abuse  Patient presented late last night to the ED with n/v, fever x 3 days. Work up showed Hgb of 3.3 and period is just finishing up. CMP, lipase, covid test all negative  I was called at approximately 0900 today and recommended starting on PO Lysteda and trying to get an u/s today/before she was discharged. She received her first dose of Lysteda at 1115am. U/s showed a 2-3cm likely endometrial polyp vs submucosal fibroid.   Patient states that her VB had been minimal prior to presenting to the ED  ROS: A 12-point review of systems was performed and negative, except as stated in the above HPI.  OBGYN History: As per HPI. OB History  Gravida Para Term Preterm AB Living  1       1 0  SAB IAB Ectopic Multiple Live Births  1       0    # Outcome Date GA Lbr Len/2nd Weight Sex Delivery Anes PTL Lv  1 SAB             Periods: patient states she had a miscarriage in early 2022 and then no period for several months but her periods started a month ago and she had a 4-5 day period that was heavy just like this most recent one History of pap smears: unknown She is currently using none for contraception.    Past Medical History: Past Medical History:  Diagnosis Date  . Drug abuse and dependence (HCC)   . Heroin abuse [facility])     Past Surgical History: Past Surgical History:  Procedure Laterality Date  . I & D EXTREMITY Left 09/24/2015   Procedure: IRRIGATION AND DEBRIDEMENT LEFT FOREARM ABSCESS;  Surgeon: Dairl Ponder, MD;  Location: [facility] OR;   Service: Orthopedics;  Laterality: Left;  . INCISION AND DRAINAGE ABSCESS N/A 09/24/2015   Procedure: INCISION AND DRAINAGE BACK  ABSCESS;  Surgeon: Axel Filler, MD;  Location: [facility] OR;  Service: General;  Laterality: N/A;  . IRRIGATION AND DEBRIDEMENT ABSCESS N/A 09/28/2015   Procedure: IRRIGATION AND DEBRIDEMENT ABSCESS;  Surgeon: Jimmye Norman, MD;  Location: [facility] OR;  Service: General;  Laterality: N/A;    Family History:  History reviewed. No pertinent family history.  Social History:  Social History   Socioeconomic History  . Marital status: Single    Spouse name: Not on file  . Number of children: Not on file  . Years of education: Not on file  . Highest education level: Not on file  Occupational History  . Occupation: unemployed  Tobacco Use  . Smoking status: Current Every Day Smoker    Packs/day: 0.50    Types: Cigarettes  . Smokeless tobacco: Never Used  . Tobacco comment: requests patch  Vaping Use  . Vaping Use: Never used  Substance and Sexual Activity  . Alcohol use: No  . Drug use: Yes    Types: Heroin, IV, Cocaine    Comment: heroin, daily use; denies use of cocaine currently  . Sexual activity: Not on file  Other Topics Concern  . Not on file  Social History Narrative  .  Not on file   Social Determinants of Health   Financial Resource Strain: Not on file  Food Insecurity: Not on file  Transportation Needs: Not on file  Physical Activity: Not on file  Stress: Not on file  Social Connections: Not on file  Intimate Partner Violence: Not on file    Allergy: Allergies  Allergen Reactions  . Penicillins Hives    Current Outpatient Medications: None   Hospital Medications: Current Facility-Administered Medications  Medication Dose Route Frequency Provider Last Rate Last Admin  . 0.9 %  sodium chloride infusion  10 mL/hr Intravenous Once Jonah BlueYates, Jennifer, MD      . acetaminophen (TYLENOL) tablet 650 mg  650 mg Oral Q6H PRN Jonah BlueYates, Jennifer, MD        Or  . acetaminophen (TYLENOL) suppository 650 mg  650 mg Rectal Q6H PRN Jonah BlueYates, Jennifer, MD      . albuterol (PROVENTIL) (2.5 MG/3ML) 0.083% nebulizer solution 2.5 mg  2.5 mg Nebulization Q2H PRN Jonah BlueYates, Jennifer, MD      . cloNIDine (CATAPRES) tablet 0.1 mg  0.1 mg Oral QID Jonah BlueYates, Jennifer, MD       Followed by  . [START ON 07/10/2020] cloNIDine (CATAPRES) tablet 0.1 mg  0.1 mg Oral Myles LippsBH-qamhs Yates, Jennifer, MD       Followed by  . [START ON 07/12/2020] cloNIDine (CATAPRES) tablet 0.1 mg  0.1 mg Oral QAC breakfast Jonah BlueYates, Jennifer, MD      . dicyclomine (BENTYL) tablet 20 mg  20 mg Oral Q6H PRN Jonah BlueYates, Jennifer, MD      . hydrALAZINE (APRESOLINE) injection 5 mg  5 mg Intravenous Q4H PRN Jonah BlueYates, Jennifer, MD      . HYDROcodone-acetaminophen (NORCO/VICODIN) 5-325 MG per tablet 1-2 tablet  1-2 tablet Oral Q4H PRN Jonah BlueYates, Jennifer, MD      . hydrOXYzine (ATARAX/VISTARIL) tablet 25 mg  25 mg Oral Q6H PRN Jonah BlueYates, Jennifer, MD      . lactated ringers infusion   Intravenous Continuous Jonah BlueYates, Jennifer, MD 75 mL/hr at 07/08/20 1013 New Bag at 07/08/20 1013  . loperamide (IMODIUM) capsule 2-4 mg  2-4 mg Oral PRN Jonah BlueYates, Jennifer, MD      . methocarbamol (ROBAXIN) tablet 500 mg  500 mg Oral Q8H PRN Jonah BlueYates, Jennifer, MD      . naproxen (NAPROSYN) tablet 500 mg  500 mg Oral BID PRN Jonah BlueYates, Jennifer, MD      . nicotine (NICODERM CQ - dosed in mg/24 hours) patch 14 mg  14 mg Transdermal Daily Jonah BlueYates, Jennifer, MD   14 mg at 07/08/20 1132  . ondansetron (ZOFRAN) tablet 4 mg  4 mg Oral Q6H PRN Jonah BlueYates, Jennifer, MD       Or  . ondansetron [facility](ZOFRAN) injection 4 mg  4 mg Intravenous Q6H PRN Jonah BlueYates, Jennifer, MD      . sodium chloride flush (NS) 0.9 % injection 3 mL  3 mL Intravenous Q12H Jonah BlueYates, Jennifer, MD      . tranexamic acid (LYSTEDA) tablet 1,300 mg  1,300 mg Oral TID Jonah BlueYates, Jennifer, MD   1,300 mg at 07/08/20 1112   No current outpatient medications on file.     Physical Exam:  Current Vital Signs 24h Vital Sign  Ranges  T 98.5 F (36.9 C) Temp  Avg: 98.5 F (36.9 C)  Min: 98.2 F (36.8 C)  Max: 98.7 F (37.1 C)  BP 98/61 BP  Min: 87/45  Max: 114/74  HR 96 Pulse  Avg: 96.5  Min: 84  Max:  110  RR 15 Resp  Avg: 15.5  Min: 11  Max: 18  SaO2 100 % Room Air SpO2  Avg: 97.8 %  Min: 94 %  Max: 100 %       24 Hour I/O Current Shift I/O  Time Ins Outs 06/03 0701 - 06/04 0700 In: 1000.3  Out: -  No intake/output data recorded.   Patient Vitals for the past 24 hrs:  BP Temp Temp src Pulse Resp SpO2 Height Weight  07/08/20 1200 98/61 -- -- -- 15 -- -- --  07/08/20 1115 (!) 94/41 -- -- 96 15 100 % -- --  07/08/20 1015 (!) 91/50 -- -- 93 16 99 % -- --  07/08/20 1008 (!) 88/51 98.5 F (36.9 C) Oral 89 16 100 % -- --  07/08/20 0939 (!) 99/48 98.2 F (36.8 C) Oral 100 15 98 % -- --  07/08/20 0915 (!) 87/45 -- -- 94 15 100 % -- --  07/08/20 0800 (!) 96/57 -- -- -- 16 -- -- --  07/08/20 0743 (!) 89/46 98.3 F (36.8 C) Oral 84 15 98 % -- --  07/08/20 0719 (!) 88/46 -- -- 93 11 99 % -- --  07/08/20 0612 -- -- -- -- -- -- 5\' 3"  (1.6 m) 51.3 kg  07/08/20 0611 (!) 90/46 98.6 F (37 C) Oral 86 15 99 % -- --  07/08/20 0607 -- 98.6 F (37 C) Oral -- -- -- -- --  07/08/20 0501 114/74 98.7 F (37.1 C) Oral (!) 110 18 95 % -- --  07/08/20 0224 114/70 -- -- (!) 108 18 94 % -- --  07/07/20 2309 114/70 98.7 F (37.1 C) Oral (!) 108 16 94 % -- --  07/07/20 2309 -- -- -- -- -- -- 5\' 3"  (1.6 m) 51.3 kg    Body mass index is 20.02 kg/m. General appearance: Well nourished, well developed female in no acute distress.  Neck:  Supple, normal appearance, and no thyromegaly  Cardiovascular: S1, S2 normal, no murmur, rub or gallop, regular rate and rhythm Respiratory:  Clear to auscultation bilateral. Normal respiratory effort Abdomen: positive bowel sounds and no masses, hernias; diffusely non tender to palpation, non distended Neuro/Psych:  Normal mood and affect.  Skin:  Warm and dry.  Extremities: no clubbing,  cyanosis, or edema.  Lymphatic:  No inguinal lymphadenopathy.   Pelvic exam: deferred   Laboratory: As per HPI Recent Labs  Lab 07/08/20 0214 07/08/20 0944  WBC 7.0 5.4  HGB 3.3* 3.1*  HCT 11.7* 12.0*  PLT 308 306   Recent Labs  Lab 07/08/20 0214  NA 130*  K 3.4*  CL 98  CO2 27  BUN 10  CREATININE 0.79  CALCIUM 7.9*  PROT 6.3*  BILITOT 0.5  ALKPHOS 37*  ALT 12  AST 27  GLUCOSE 146*   No results for input(s): APTT, INR, PTT in the last 168 hours.  Invalid input(s): DRHAPTT Recent Labs  Lab 07/08/20 0845  ABORH O POS Performed at [facility], [address], [place], [address] [place]     Imaging:  Narrative & Impression  CLINICAL DATA:  Dysfunctional uterine bleeding  EXAM: TRANSABDOMINAL AND TRANSVAGINAL ULTRASOUND OF PELVIS  TECHNIQUE: Both transabdominal and transvaginal ultrasound examinations of the pelvis were performed. Transabdominal technique was performed for global imaging of the pelvis including uterus, ovaries, adnexal regions, and pelvic cul-de-sac. It was necessary to proceed with endovaginal exam following the transabdominal exam to visualize the uterus, endometrium, ovaries,  and adnexa.  COMPARISON:  None  FINDINGS: Uterus  Measurements: 8.9 x 4.7 x 5.2 cm = volume: 114 mL. No fibroids or other mass visualized.  Endometrium  Thickness: 11 mm. There is a focal, masslike lesion within the endometrial cavity measuring 2.5 x 1.5 x 1.1 cm, with some internal Doppler flow.  Right ovary  Measurements: 3.8 x 3.3 x 3.2 cm = volume: 21 mL. Normal appearance/no adnexal mass. Multiple small follicles.  Left ovary  Measurements: 3.3 x 1.9 x 2.3 cm = volume: 6 mL. Normal appearance/no adnexal mass. Multiple small follicles.  Other findings  No abnormal free fluid.  IMPRESSION: 1. There is a focal, masslike lesion within the endometrial cavity measuring 2.5 x 1.5 x 1.1 cm, with some internal Doppler flow.  This may represent an endometrial polyp or submucosal fibroid. 2. No other ultrasound abnormality of the pelvis.   Electronically Signed   By: Lauralyn Primes M.D.   On: 07/08/2020 11:20     Assessment: Kathryn Blankenship is a 25 y.o. G1P0010 (Patient's last menstrual period was 07/07/2020.) with severe anemia and resolving AUB; pt stable  Plan: Continue lysteda 1300mg  po tid x 5 days and then I recommend she start continuous OCPs and to continue them until surgery. I don't know if she has insurance but if she has insurance I recommend starting seasonique the day after she finishes the [place]. If she does not have insurance, she can do Sprintec and instruct her to not do the placebo pills.  I told her that the polyp or fibroid that is seen on u/s is likely what's causing the heavy periods and I recommend removal via outpatient surgery via hysteroscopy.  I told her that before she can have this surgery she needs to be healthy and clean  I will send a message to our clinic to set her up with an appointment in our clinic for this upcoming week  I added on a beta hcg (not done) and an RPR; she has an HIV pending.   *Dispo: per Hospitalist team  Total time taking care of the patient was 30 minutes, with greater than 50% of the time spent in face to face interaction with the patient.  [place] MD Attending Center for [facility] (Faculty Practice) GYN Consult Phone: [phone] (M-F, [phone]) & [phone] (Off hours, weekends, holidays)

## 2020-07-08 NOTE — ED Triage Notes (Signed)
Brought via [facility] from [facility] due to hgb 3.3. seen at other facility due to N/V and dizziness x 3 days

## 2020-07-09 LAB — TYPE AND SCREEN
ABO/RH(D): O POS
Antibody Screen: NEGATIVE
Unit division: 0
Unit division: 0

## 2020-07-09 LAB — BPAM RBC
Blood Product Expiration Date: 202207012359
Blood Product Expiration Date: 202207082359
ISSUE DATE / TIME: 202206040935
ISSUE DATE / TIME: 202206041242
Unit Type and Rh: 5100
Unit Type and Rh: 5100

## 2020-07-09 LAB — RPR: RPR Ser Ql: NONREACTIVE

## 2020-07-09 NOTE — Discharge Summary (Signed)
Discharge Summary  Kathryn Blankenship TKZ:[id number] DOB: 1995/09/04  PCP: Patient, No Pcp Per (Inactive)  Admit date: 07/08/2020 Discharge date: 07/09/2020   Time spent: 35 minutes  Admitted From: 6/4 Disposition:  6/4 - LEFT AGAINST MEDICAL ADVICE  Recommendations for Outpatient Follow-up:  1. Follow up with PCP this week for CBC recheck 2. Follow up with GYN for surgery  3. Stop using heroin/IV drugs    Discharge Diagnoses:  Active Hospital Problems   Diagnosis Date Noted  . Severe anemia 07/08/2020  . Abnormal uterine bleeding (AUB) 07/08/2020  . Cigarette smoker 09/25/2015  . Heroin abuse (HCC) 09/24/2015    Resolved Hospital Problems  No resolved problems to display.    Discharge Condition: Unstable  CODE STATUS: Full Diet recommendation:  Regular   Vitals:   07/08/20 1758 07/08/20 2026  BP: 107/66 (!) 93/45  Pulse: 93 91  Resp: 18 18  Temp: (!) 97.5 F (36.4 C) 98.5 F (36.9 C)  SpO2: 100% 100%    History of present illness:  Kathryn Blankenship is a 25 y.o. year old female with medical history significant for IVDA (heroin) with h/o vertebral osteo who presented on 07/08/2020 with weakness and was found to have severe anemia associated with DUB. Remaining hospital course addressed in problem based format below:   Hospital Course:   Symptomatic anemia -Patient without prior h/o DUB or anemia requiring transfusion presenting with fatigue, SOB -Hgb 4.0; MCV 62.9, RDW 17.6 -Iron deficiency anemia, currently transfusing but she is likely to also benefit from IV iron infusion  - left AMA and so unable to provide -Transfused 2 units PRBC with resultant increased in Hgb from 3.1 to 7.9.  -Patient counseled about short- and long-term risks associated with transfusion and consents to receive blood products.  DUB -She reports an extremely heavy period, currently improving and on day 4 -She denies typically heavy periods and does note miscarriage that occurred 4 months  ago -She was seen by [facility] last week and had STI testing -Given TXA as per GYN -GYN consulted -Pelvic US ordered -She was recommended to continued Lysteda (TXA) x 5 days but left AMA without prescription -Recommended to start OCPs and continue until surgery - if she follows up with the [facility], Solmon Ice is recommended -US showed a polyp or fibroid which is likely the source of her bleeding; she needs outpatient surgery via hysteroscopy but needs to stop using IV drugs first -RPR is pending, HIV negative  IVDA/heroin dependence -Long-standing heroin dependence -She reports a desire to quit and recognizes that ongoing use is life-threatening -However, she declined initiation of Suboxone while in the hospital -[facility] team consulted for substance abuse education/support but did not have time to see the patient prior to her leaving AMA -prn orders from the Clonidine withdrawal order set were also ordered.  Tobacco dependence -Encourage cessation.   -This was discussed with the patient and should be reviewed on an ongoing basis.     Consultations:  GYN  Procedures/Studies: Pelvic US  Discharge Exam: BP (!) 93/45 (BP Location: Right Arm)   Pulse 91   Temp 98.5 F (36.9 C) (Oral)   Resp 18   Ht 5\' 3"  (1.6 m)   Wt 51.3 kg   LMP 07/07/2020   SpO2 100%   Breastfeeding Unknown   BMI 20.02 kg/m   UNABLE TO PERFORM - LEFT AMA   Discharge Instructions Follow up: Please make an appointment to see your primary physician for follow up within 7 days of  hospital discharge.  At that appointment:  -Have bloodwork - repeat CBC  -Please get all medicines reviewed and adjusted.  -Please request that your primary physician go over all hospital tests and procedure/radiological results at the follow up.  Please get all hospital records sent to your physician by signing a hospital release before you go home.  Activity: As tolerated; stop using heroin  Disposition: Home    Diet:    regular   For all patients - If you experience worsening of your admission symptoms or develop shortness of breath, life threatening emergency, suicidal or homicidal thoughts you must seek medical attention immediately by calling 911 or calling your MD immediately.  Read complete instructions along with all the possible side effects for all the medicines you take and that have been prescribed to you. Take any new medicines after you have completely understood and accept all the possible adverse reactions/side effects.   Do not drive, operate heavy machinery, perform activities at heights, swimming or participation in water activities or provide baby sitting services if your were admitted for syncope/seizures until you have seen by Primary MD/Neurologist and advised to do so.  Do not drive when taking pain medications.    Do not take more than prescribed pain, sleep and anxiety medications.  Special Instructions: If you have smoked or chewed Tobacco  in the last 2 yrs please stop smoking; also stop any regular Alcohol and/or any Recreational drug use including marijuana.  Wear Seat belts while driving.   Please note:  You were cared for by a hospitalist during your hospital stay. If you have any questions about your discharge medications or the care you received while you were in the hospital, you can call the unit and asked to speak with the hospitalist on call. Once you are discharged, your primary care physician will handle any further medical issues. Please note that NO REFILLS for any discharge medications will be authorized, as it is imperative that you return to your primary care physician (or establish a relationship with a primary care physician if you do not have one) for your aftercare needs so that they can reassess your need for medications and monitor your lab values.   Allergies as of 07/08/2020      Reactions   Penicillins Hives      Medication List    You have not been  prescribed any medications.    Allergies  Allergen Reactions  . Penicillins Hives    Follow-up Information    [facility] Call in 4 day(s).   Specialty: Obstetrics and Gynecology Why: Call the office if you have not received an appointment by Wednesday Contact information: [address] [place] [phone]               The results of significant diagnostics from this hospitalization (including imaging, microbiology, ancillary and laboratory) are listed below for reference.    Significant Diagnostic Studies: US PELVIC COMPLETE WITH TRANSVAGINAL  Result Date: 07/08/2020 CLINICAL DATA:  Dysfunctional uterine bleeding EXAM: TRANSABDOMINAL AND TRANSVAGINAL ULTRASOUND OF PELVIS TECHNIQUE: Both transabdominal and transvaginal ultrasound examinations of the pelvis were performed. Transabdominal technique was performed for global imaging of the pelvis including uterus, ovaries, adnexal regions, and pelvic cul-de-sac. It was necessary to proceed with endovaginal exam following the transabdominal exam to visualize the uterus, endometrium, ovaries, and adnexa. COMPARISON:  None FINDINGS: Uterus Measurements: 8.9 x 4.7 x 5.2 cm = volume: 114  mL. No fibroids or other mass visualized. Endometrium Thickness: 11 mm. There is a focal, masslike lesion within the endometrial cavity measuring 2.5 x 1.5 x 1.1 cm, with some internal Doppler flow. Right ovary Measurements: 3.8 x 3.3 x 3.2 cm = volume: 21 mL. Normal appearance/no adnexal mass. Multiple small follicles. Left ovary Measurements: 3.3 x 1.9 x 2.3 cm = volume: 6 mL. Normal appearance/no adnexal mass. Multiple small follicles. Other findings No abnormal free fluid. IMPRESSION: 1. There is a focal, masslike lesion within the endometrial cavity measuring 2.5 x 1.5 x 1.1 cm, with some internal Doppler flow. This may represent an endometrial polyp or submucosal fibroid.  2. No other ultrasound abnormality of the pelvis. Electronically Signed   By: Lauralyn Primes M.D.   On: 07/08/2020 11:20    Microbiology: Recent Results (from the past 240 hour(s))  Resp Panel by RT-PCR (Flu A&B, Covid) Nasopharyngeal Swab     Status: None   Collection Time: 07/08/20  2:14 AM   Specimen: Nasopharyngeal Swab; Nasopharyngeal(NP) swabs in vial transport medium  Result Value Ref Range Status   SARS Coronavirus 2 by RT PCR NEGATIVE NEGATIVE Final    Comment: (NOTE) SARS-CoV-2 target nucleic acids are NOT DETECTED.  The SARS-CoV-2 RNA is generally detectable in upper respiratory specimens during the acute phase of infection. The lowest concentration of SARS-CoV-2 viral copies this assay can detect is 138 copies/mL. A negative result does not preclude SARS-Cov-2 infection and should not be used as the sole basis for treatment or other patient management decisions. A negative result may occur with  improper specimen collection/handling, submission of specimen other than nasopharyngeal swab, presence of viral mutation(s) within the areas targeted by this assay, and inadequate number of viral copies(<138 copies/mL). A negative result must be combined with clinical observations, patient history, and epidemiological information. The expected result is Negative.  Fact Sheet for Patients:  BloggerCourse.com  Fact Sheet for Healthcare Providers:  SeriousBroker.it  This test is no t yet approved or cleared by the [place] FDA and  has been authorized for detection and/or diagnosis of SARS-CoV-2 by FDA under an Emergency Use Authorization (EUA). This EUA will remain  in effect (meaning this test can be used) for the duration of the COVID-19 declaration under Section 564(b)(1) of the Act, 21 U.S.C.section 360bbb-3(b)(1), unless the authorization is terminated  or revoked sooner.       Influenza A by PCR NEGATIVE NEGATIVE  Final   Influenza B by PCR NEGATIVE NEGATIVE Final    Comment: (NOTE) The Xpert Xpress SARS-CoV-2/FLU/RSV plus assay is intended as an aid in the diagnosis of influenza from Nasopharyngeal swab specimens and should not be used as a sole basis for treatment. Nasal washings and aspirates are unacceptable for Xpert Xpress SARS-CoV-2/FLU/RSV testing.  Fact Sheet for Patients: BloggerCourse.com  Fact Sheet for Healthcare Providers: SeriousBroker.it  This test is not yet approved or cleared by the [place] FDA and has been authorized for detection and/or diagnosis of SARS-CoV-2 by FDA under an Emergency Use Authorization (EUA). This EUA will remain in effect (meaning this test can be used) for the duration of the COVID-19 declaration under Section 564(b)(1) of the Act, 21 U.S.C. section 360bbb-3(b)(1), unless the authorization is terminated or revoked.  Performed at [facility], [address] [place], [place] [address]      Labs: Basic Metabolic Panel: Recent Labs  Lab 07/08/20 0214 07/08/20 1757  NA 130* 135  K 3.4* 4.3  CL 98  106  CO2 27 24  GLUCOSE 146* 97  BUN 10 8  CREATININE 0.79 0.64  CALCIUM 7.9* 7.8*   Liver Function Tests: Recent Labs  Lab 07/08/20 0214  AST 27  ALT 12  ALKPHOS 37*  BILITOT 0.5  PROT 6.3*  ALBUMIN 2.9*   Recent Labs  Lab 07/08/20 0214  LIPASE 24   No results for input(s): AMMONIA in the last 168 hours. CBC: Recent Labs  Lab 07/08/20 0214 07/08/20 0944 07/08/20 1927  WBC 7.0 5.4 4.6  NEUTROABS 4.6  --   --   HGB 3.3* 3.1* 7.9*  HCT 11.7* 12.0* 27.0*  MCV 62.9* 65.2* 71.8*  PLT 308 306 346   Cardiac Enzymes: No results for input(s): CKTOTAL, CKMB, CKMBINDEX, TROPONINI in the last 168 hours. BNP: BNP (last 3 results) No results for input(s): BNP in the last 8760 hours.  ProBNP (last 3 results) No results for input(s): PROBNP in the last 8760  hours.  CBG: No results for input(s): GLUCAP in the last 168 hours.     Signed:  Jonah Blue, MD [facility] 07/09/2020, 8:40 AM

## 2020-07-09 NOTE — Progress Notes (Signed)
Patient has visitor at bedside.  Patient is calm and eloquent in her wishes.  She wishes to leave AMA.  I explained that her hgb is now 7.9 - up from 3.1 at admission.  I explained that she needed to remain at least overnight for closer monitoring and to make sure her hgb remains stable.  The risks were explained to her; especially if hgb dropped drastically again.  I stressed the importance of coming back to the ED if she became symptomatic or began to bleed again.  I also expressed the importance of setting a follow up appointment with the gynecologist as soon as possible.  She is still adamant about leaving.  AMA paper signed by patient.  IV removed without complications.  The patient and her guest left without any further issues.  Bernie Covey RN

## 2020-08-03 ENCOUNTER — Encounter: Payer: Medicaid Other | Admitting: Obstetrics and Gynecology

## 2020-08-03 ENCOUNTER — Encounter: Payer: Self-pay | Admitting: Obstetrics and Gynecology

## 2020-08-03 NOTE — Progress Notes (Signed)
Patient did not keep her GYN ED referral appointment for 08/03/2020.  Cornelia Copa MD Attending [facility] for [employer] [facility])

## 2022-06-04 IMAGING — CR DG CHEST 1V PORT
1 series · 1 of 1 positions shown · non-contrast
Comparison: CT 09/24/2015, radiograph 09/24/2015

CLINICAL DATA: Shortness of breath, fever

EXAM:
PORTABLE CHEST 1 VIEW

[chest pa]
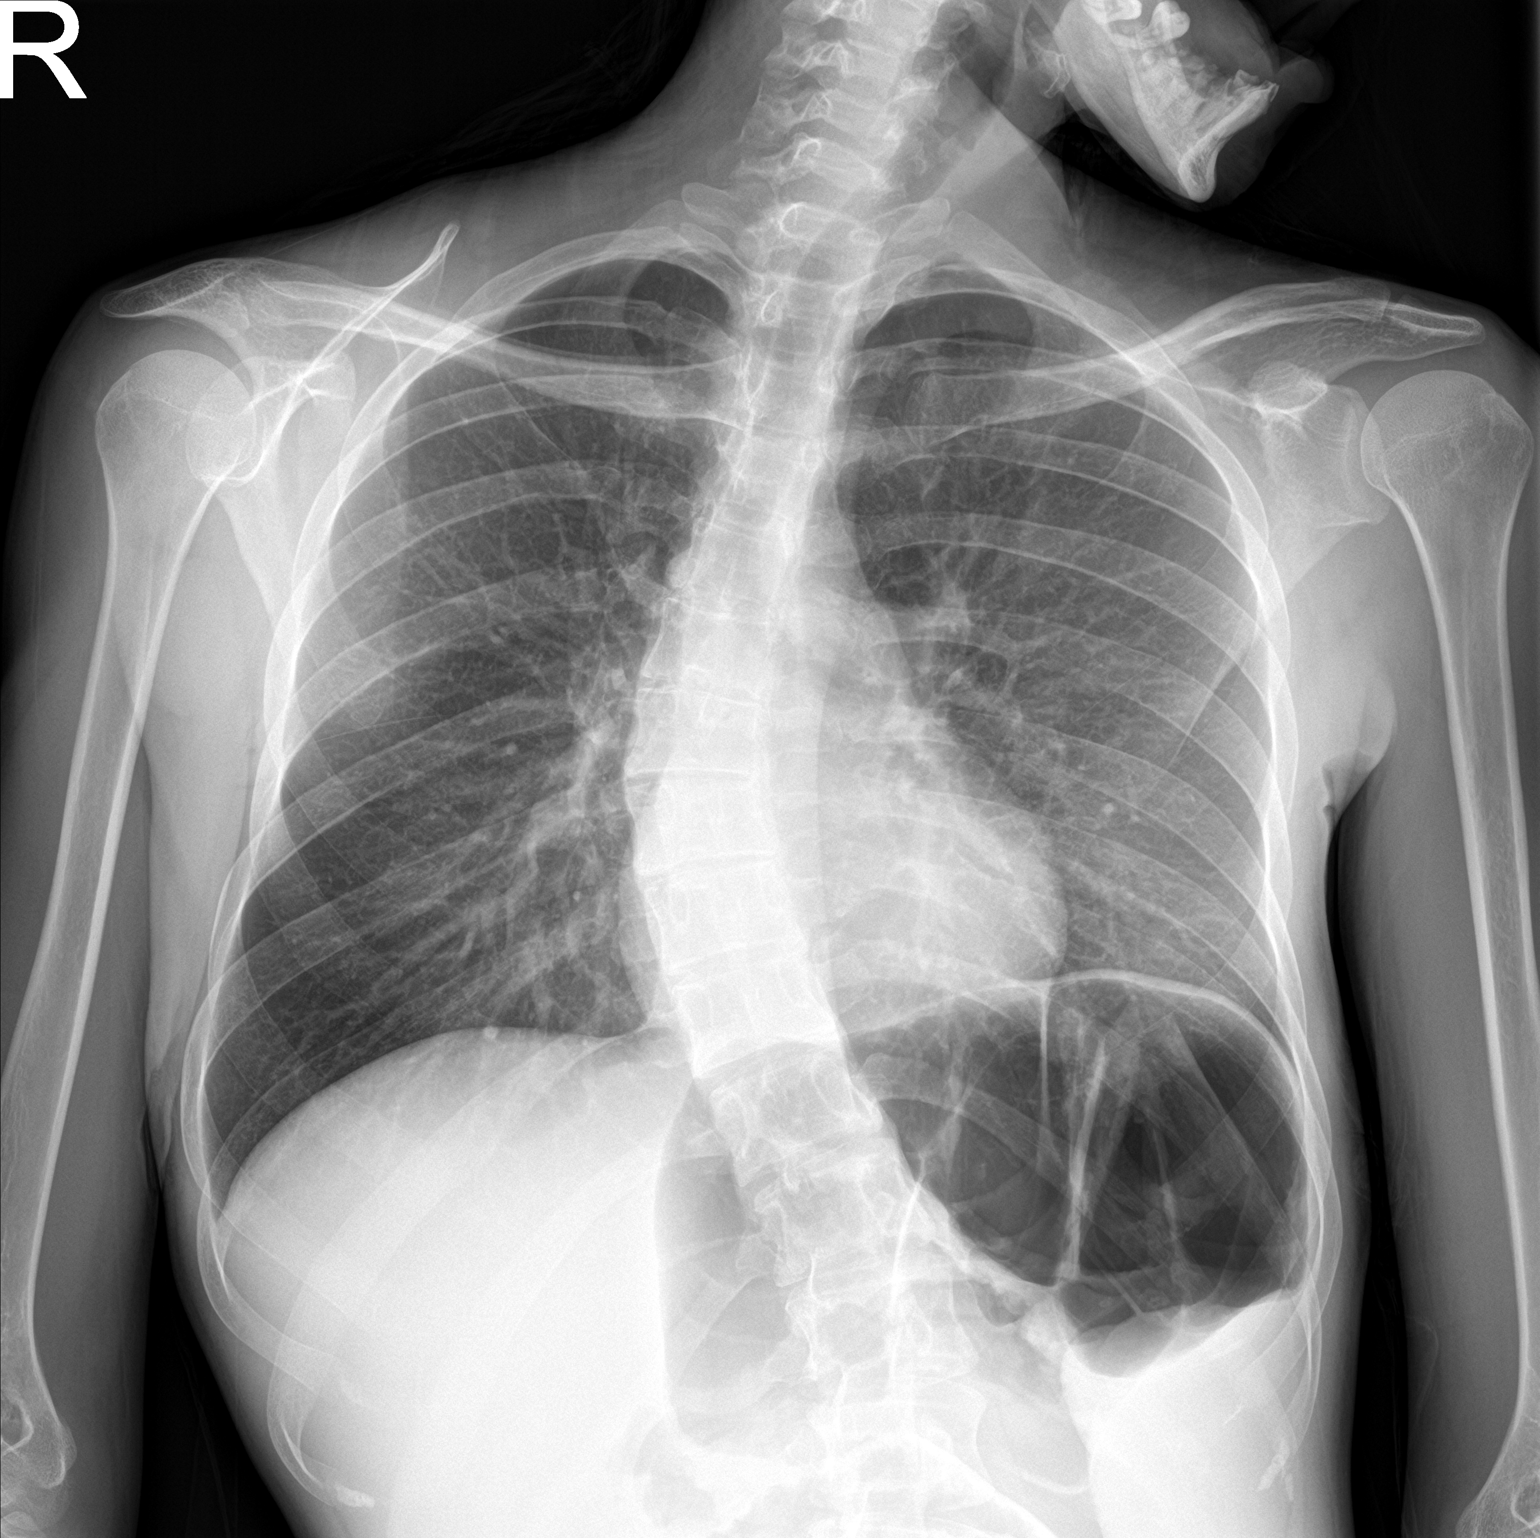

[1 of 1 positions shown; findings below may reference images not displayed]

FINDINGS: Low volumes and atelectatic change without focal consolidative
opacity. No pneumothorax. No effusion. Mild airways thickening is
present. Pulmonary vascularity is normally distributed. Slight
asymmetric elevation of left hemidiaphragm with air distention of
the splenic flexure, nonspecific. No subdiaphragmatic free air. The
cardiomediastinal contours are unremarkable. Chronic dextrocurvature
of the thoracic spine with chest wall deformity. No acute osseous or
soft tissue abnormality.
IMPRESSION: 1. Low volumes and atelectasis without focal consolidative opacity.
2. Mild airways thickening may reflect bronchitis or reactive
airways disease.
3. Mild air distention of the splenic flexure, nonspecific.
Correlate for abdominal symptoms.

## 2022-11-16 IMAGING — US US PELVIS COMPLETE WITH TRANSVAGINAL
1 series · 13 of 25 positions shown · non-contrast
Comparison: None

CLINICAL DATA: Dysfunctional uterine bleeding



[Series 1: us pelvic complete with transvaginal · 13 of 83 slices shown]
[im 1/83]
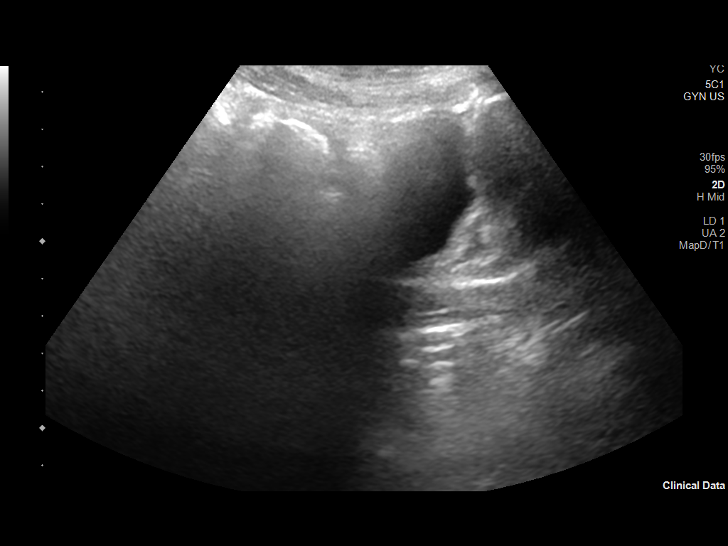
[im 7/83]
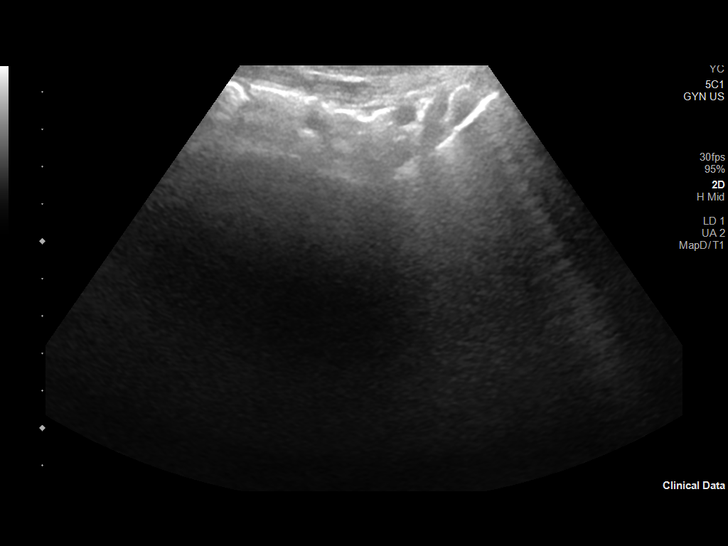
[im 14/83]
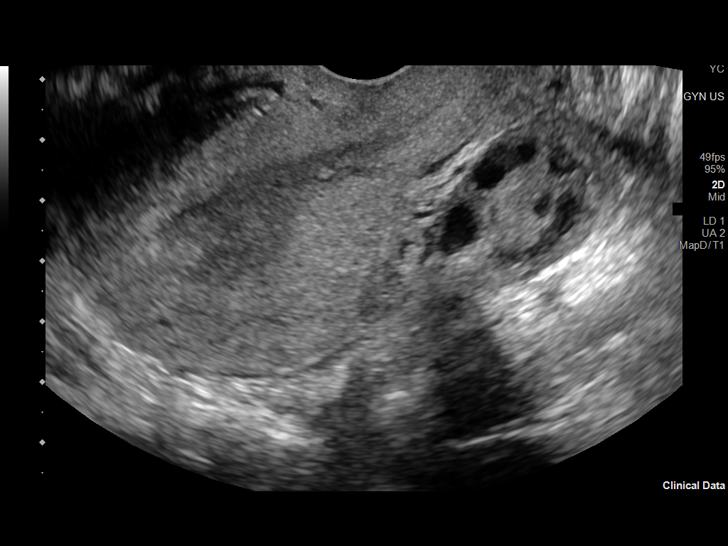
[im 21/83]
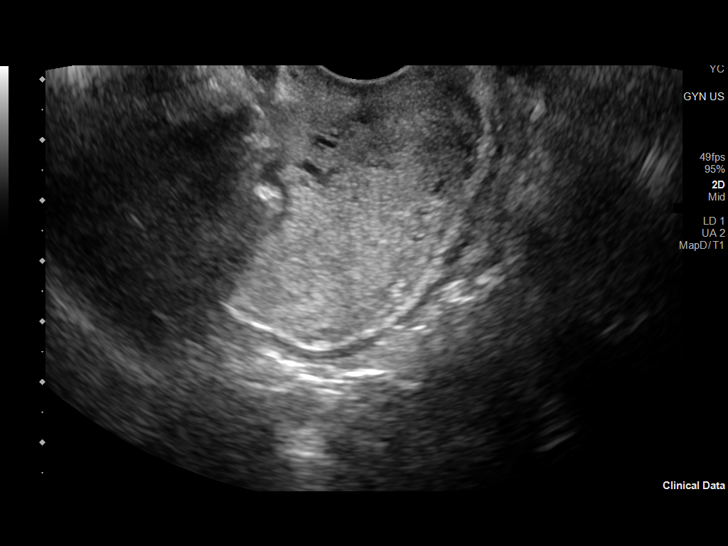
[im 28/83]
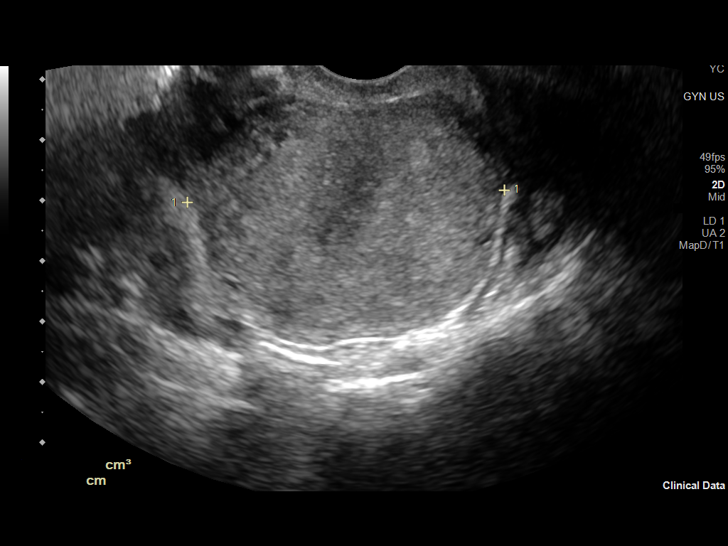
[im 35/83]
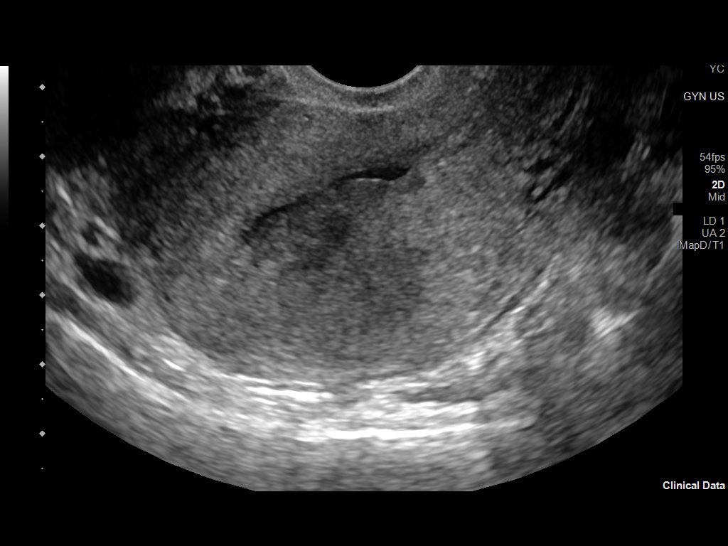
[im 42/83]
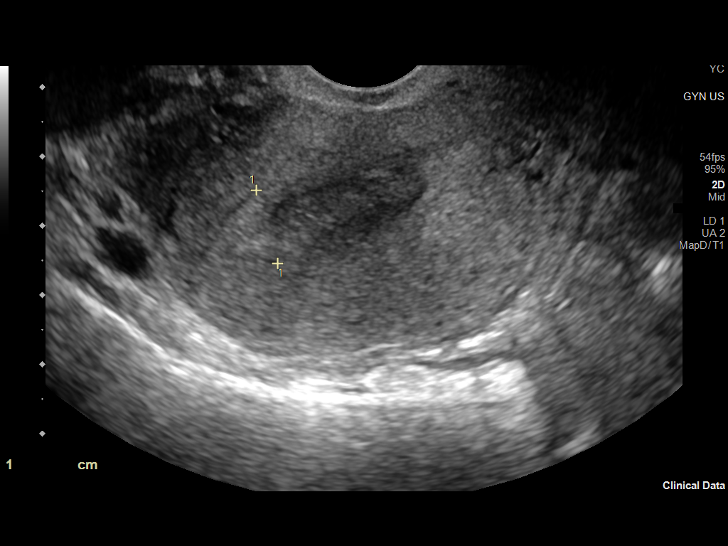
[im 48/83]
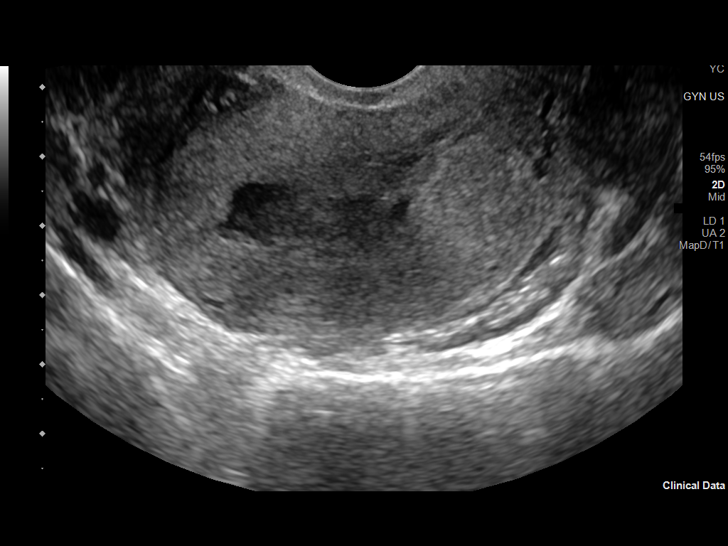
[im 55/83]
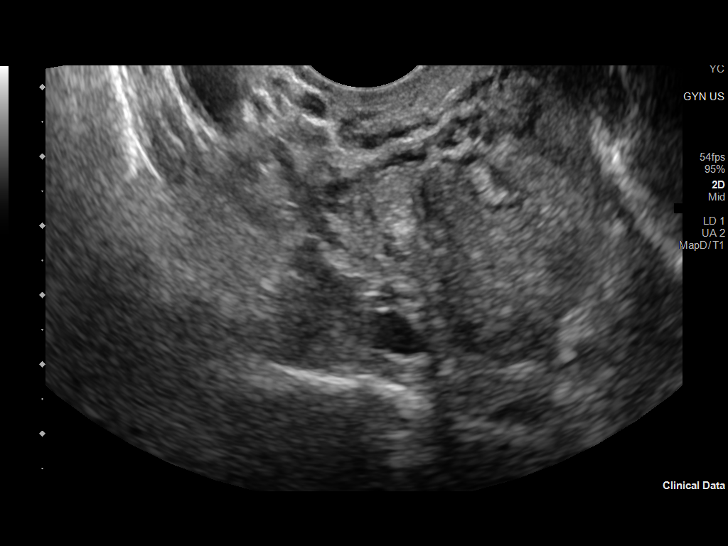
[im 62/83]
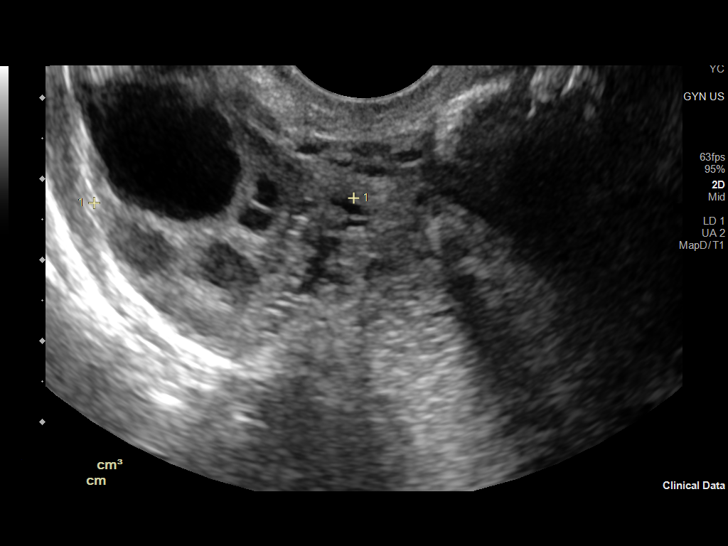
[im 69/83]
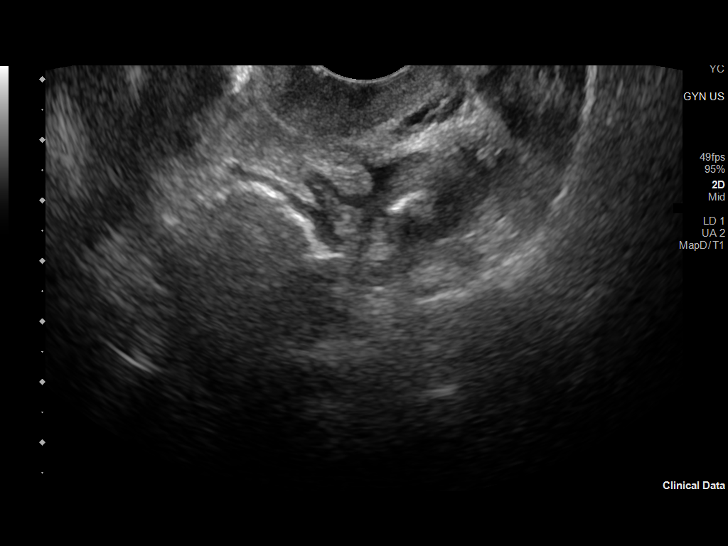
[im 76/83]
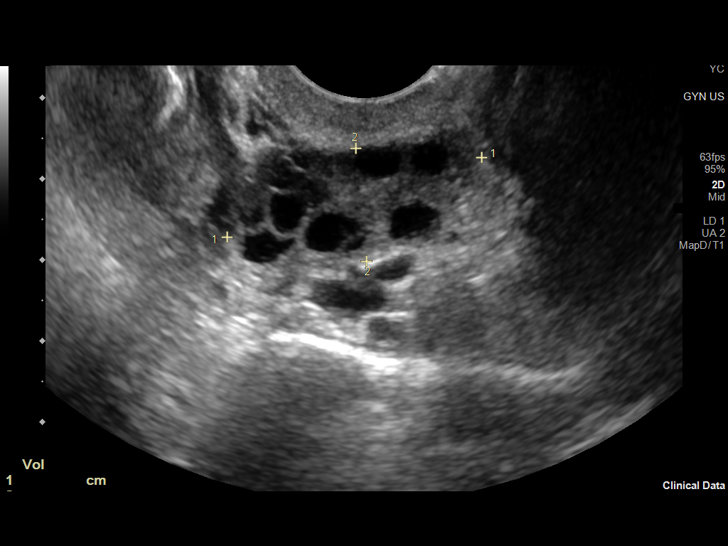
[im 83/83]
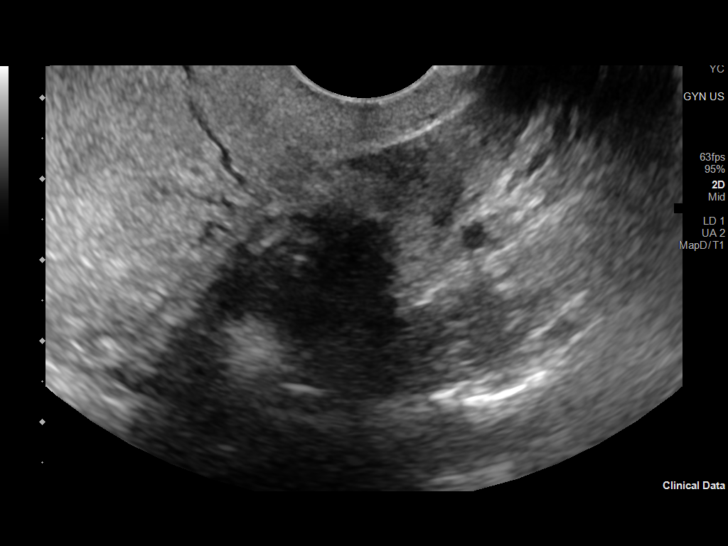

[13 of 25 positions shown; findings below may reference images not displayed]

FINDINGS: Uterus

Measurements: 8.9 x 4.7 x 5.2 cm = volume: 114 mL. No fibroids or
other mass visualized.

Endometrium

Thickness: 11 mm. There is a focal, masslike lesion within the
endometrial cavity measuring 2.5 x 1.5 x 1.1 cm, with some internal
Doppler flow.

Right ovary

Measurements: 3.8 x 3.3 x 3.2 cm = volume: 21 mL. Normal
appearance/no adnexal mass. Multiple small follicles.

Left ovary

Measurements: 3.3 x 1.9 x 2.3 cm = volume: 6 mL. Normal
appearance/no adnexal mass. Multiple small follicles.

Other findings

No abnormal free fluid.
IMPRESSION: 1. There is a focal, masslike lesion within the endometrial cavity
measuring 2.5 x 1.5 x 1.1 cm, with some internal Doppler flow. This
may represent an endometrial polyp or submucosal fibroid.
2. No other ultrasound abnormality of the pelvis.

## 2023-05-03 ENCOUNTER — Emergency Department (HOSPITAL_COMMUNITY)

## 2023-05-03 ENCOUNTER — Encounter (HOSPITAL_COMMUNITY): Payer: Self-pay

## 2023-05-03 ENCOUNTER — Emergency Department (HOSPITAL_COMMUNITY)
Admission: EM | Admit: 2023-05-03 | Discharge: 2023-05-04 | Disposition: A | Discharge status: Home / Self Care | Attending: Emergency Medicine | Admitting: Emergency Medicine

## 2023-05-03 ENCOUNTER — Other Ambulatory Visit: Payer: Self-pay

## 2023-05-03 DIAGNOSIS — F1190 Opioid use, unspecified, uncomplicated: Secondary | ICD-10-CM | POA: Insufficient documentation

## 2023-05-03 DIAGNOSIS — X58XXXA Exposure to other specified factors, initial encounter: Secondary | ICD-10-CM | POA: Diagnosis not present

## 2023-05-03 DIAGNOSIS — S29001A Unspecified injury of muscle and tendon of front wall of thorax, initial encounter: Secondary | ICD-10-CM | POA: Diagnosis present

## 2023-05-03 DIAGNOSIS — R079 Chest pain, unspecified: Secondary | ICD-10-CM

## 2023-05-03 DIAGNOSIS — S2222XA Fracture of body of sternum, initial encounter for closed fracture: Secondary | ICD-10-CM | POA: Insufficient documentation

## 2023-05-03 LAB — CBC
HCT: 31.3 % — ABNORMAL LOW (ref 36.0–46.0)
Hemoglobin: 9.2 g/dL — ABNORMAL LOW (ref 12.0–15.0)
MCH: 21.5 pg — ABNORMAL LOW (ref 26.0–34.0)
MCHC: 29.4 g/dL — ABNORMAL LOW (ref 30.0–36.0)
MCV: 73.1 fL — ABNORMAL LOW (ref 80.0–100.0)
Platelets: 490 10*3/uL — ABNORMAL HIGH (ref 150–400)
RBC: 4.28 MIL/uL (ref 3.87–5.11)
RDW: 19.4 % — ABNORMAL HIGH (ref 11.5–15.5)
WBC: 11 10*3/uL — ABNORMAL HIGH (ref 4.0–10.5)
nRBC: 0 % (ref 0.0–0.2)

## 2023-05-03 LAB — BASIC METABOLIC PANEL WITH GFR
Anion gap: 7 (ref 5–15)
BUN: 10 mg/dL (ref 6–20)
CO2: 27 mmol/L (ref 22–32)
Calcium: 8.2 mg/dL — ABNORMAL LOW (ref 8.9–10.3)
Chloride: 100 mmol/L (ref 98–111)
Creatinine, Ser: 0.55 mg/dL (ref 0.44–1.00)
GFR, Estimated: >60 mL/min (ref >60)
Glucose, Bld: 112 mg/dL — ABNORMAL HIGH (ref 70–99)
Potassium: 3.4 mmol/L — ABNORMAL LOW (ref 3.5–5.1)
Sodium: 134 mmol/L — ABNORMAL LOW (ref 135–145)

## 2023-05-03 LAB — TROPONIN I (HIGH SENSITIVITY): Troponin I (High Sensitivity): 2 ng/L (ref <18)

## 2023-05-03 MED ORDER — DOXYCYCLINE HYCLATE 100 MG PO CAPS: 100.0000 mg | ORAL_CAPSULE | Freq: Two times a day (BID) | ORAL | 0 refills | Status: AC

## 2023-05-03 MED ORDER — NAPROXEN 500 MG PO TABS: 500.0000 mg | ORAL_TABLET | Freq: Two times a day (BID) | ORAL | 0 refills | Status: AC
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
## 2023-05-03 NOTE — ED Notes (Signed)
 Patient refused to get Temp taken.
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
## 2023-05-04 LAB — BLOOD CULTURE ID PANEL (REFLEXED) - BCID2

## 2023-05-04 NOTE — ED Notes (Addendum)
 Attempted to call patient with no answer, voicemail is full and unable to leave message.

## 2023-05-04 NOTE — ED Notes (Signed)
 Patient was able to stand up and changed bottoms into paper scrubs.
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
## 2023-05-04 NOTE — ED Notes (Signed)
 Went into the patients room to see if she would be able to find a ride home. Patient stated "I peed on myself" I stated I could change the sheets to give me a few minutes to gather stuff and would help her. Patient then stated" I dont want you to change them, I'm in pain I need pain meds". I left the room and spoke with PA about situation. Patient is still pending DC once metabolized more and able to be alert more.

## 2023-05-04 NOTE — ED Notes (Signed)
 PA asked to see if patient would comply with getting EKG. Was able to get a EKG since patient was still hooked to monitor at this time.

## 2023-05-05 NOTE — ED Notes (Signed)
 Attempted to call patient again to return to the ED. Family member answered and advised to let her know to return our call when she returns home. Family member agreed.

## 2023-05-06 LAB — CULTURE, BLOOD (ROUTINE X 2)

## 2023-05-07 ENCOUNTER — Telehealth (HOSPITAL_BASED_OUTPATIENT_CLINIC_OR_DEPARTMENT_OTHER): Payer: Self-pay

## 2023-05-07 NOTE — Telephone Encounter (Signed)
 Post ED Visit - Positive Culture Follow-up: Unsuccessful Patient Follow-up  Culture assessed and recommendations reviewed by:  [x]  Sharin Mons, Pharm.D. []  Celedonio Miyamoto, Pharm.D., BCPS AQ-ID []  Garvin Fila, Pharm.D., BCPS []  Georgina Pillion, [address].D., BCPS []  [place], [address].D., BCPS, AAHIVP []  Estella Husk, Pharm.D., BCPS, AAHIVP []  Sherlynn Carbon, PharmD []  Pollyann Samples, PharmD, BCPS  Positive Blood culture  []  Patient discharged without antimicrobial prescription and treatment is now indicated [x]  Organism is resistant to prescribed ED discharge antimicrobial []  Patient with positive blood cultures  Plan: call pt to return to ED for treatment, ID monitoring.   Unable to contact patient after 3 attempts, letter will be sent to address on file  [name] 05/07/2023, 9:24 AM

## 2023-11-02 ENCOUNTER — Encounter (HOSPITAL_BASED_OUTPATIENT_CLINIC_OR_DEPARTMENT_OTHER): Payer: Self-pay | Admitting: Emergency Medicine

## 2023-11-02 ENCOUNTER — Emergency Department (HOSPITAL_BASED_OUTPATIENT_CLINIC_OR_DEPARTMENT_OTHER)
Admission: EM | Admit: 2023-11-02 | Discharge: 2023-11-02 | Disposition: A | Discharge status: Home / Self Care | Payer: Self-pay | Attending: Emergency Medicine | Admitting: Emergency Medicine

## 2023-11-02 ENCOUNTER — Other Ambulatory Visit: Payer: Self-pay

## 2023-11-02 DIAGNOSIS — M7989 Other specified soft tissue disorders: Secondary | ICD-10-CM | POA: Insufficient documentation

## 2023-11-02 DIAGNOSIS — Z5321 Procedure and treatment not carried out due to patient leaving prior to being seen by health care provider: Secondary | ICD-10-CM | POA: Insufficient documentation

## 2023-11-02 NOTE — ED Notes (Signed)
Called pt for triage, no answer 

## 2023-11-02 NOTE — ED Triage Notes (Signed)
 Pt caox4, ambulatory c/o redness, swelling and possible infection to R lower leg. Pt states she first noticed a spot on her R lower leg and tried to pop it and pus came out.

## 2023-11-02 NOTE — ED Notes (Signed)
 Attempted to get Blood Work on Patient without success. Charge RN spoke with Patient about doing an IV. Pt stated she needed to go pick up her children so she could not stay. Pt called her family to help pick up her children but was not successful. Pt stated she needed to go and that she would follow up with an Urgent Care once she was able to get her children settled. Pt left the ED after our conversation.

## 2023-11-02 NOTE — ED Notes (Signed)
 Called for Pt in Waiting Room with No Response. Checked Bathrooms and Back Waiting Room. Security Ship broker stated they say her go back outside. No one currently outside standing either.

## 2024-01-07 ENCOUNTER — Other Ambulatory Visit: Payer: Self-pay

## 2024-01-07 ENCOUNTER — Encounter (HOSPITAL_BASED_OUTPATIENT_CLINIC_OR_DEPARTMENT_OTHER): Payer: Self-pay | Admitting: Emergency Medicine

## 2024-01-07 ENCOUNTER — Emergency Department (HOSPITAL_BASED_OUTPATIENT_CLINIC_OR_DEPARTMENT_OTHER)
Admission: EM | Admit: 2024-01-07 | Discharge: 2024-01-08 | Discharge status: Against Medical Advice | Payer: Self-pay | Attending: Emergency Medicine | Admitting: Emergency Medicine

## 2024-01-07 ENCOUNTER — Emergency Department (HOSPITAL_BASED_OUTPATIENT_CLINIC_OR_DEPARTMENT_OTHER): Payer: Self-pay

## 2024-01-07 DIAGNOSIS — Z5329 Procedure and treatment not carried out because of patient's decision for other reasons: Secondary | ICD-10-CM | POA: Insufficient documentation

## 2024-01-07 DIAGNOSIS — R079 Chest pain, unspecified: Secondary | ICD-10-CM

## 2024-01-07 DIAGNOSIS — R6883 Chills (without fever): Secondary | ICD-10-CM | POA: Insufficient documentation

## 2024-01-07 DIAGNOSIS — R0789 Other chest pain: Secondary | ICD-10-CM | POA: Insufficient documentation

## 2024-01-07 LAB — URINALYSIS, ROUTINE W REFLEX MICROSCOPIC
Bacteria, UA: NONE SEEN
Bilirubin Urine: NEGATIVE
Glucose, UA: NEGATIVE mg/dL
Hgb urine dipstick: NEGATIVE
Ketones, ur: NEGATIVE mg/dL
Leukocytes,Ua: NEGATIVE
Nitrite: NEGATIVE
Specific Gravity, Urine: 1.023 (ref 1.005–1.030)
pH: 6.5 (ref 5.0–8.0)

## 2024-01-07 LAB — PREGNANCY, URINE: Preg Test, Ur: NEGATIVE

## 2024-01-07 NOTE — ED Triage Notes (Signed)
 Pt c/o lower back pain and chills, fatigue since 1.5 weeks ago. Prior hx UTI with similar presentation. No hematuria or burning urination.

## 2024-01-07 NOTE — ED Provider Notes (Signed)
 [place] EMERGENCY DEPARTMENT AT [facility] Provider Note   CSN: [id number] Arrival date & time: 01/07/24  2224     Patient presents with: Back Pain   Kathryn Blankenship is a 28 y.o. female.  {Add pertinent medical, surgical, social history, OB history to HPI:32947} HPI     This is a 28 year old female who presents with chills, body aches, back pain.  She states that she was seen and evaluated earlier this year and told that she had a UTI when she felt the same.  She also reports a heart and lung infection.  She states that she left the hospital before being treated.  She does not know what kind of infection she had.  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  She has not had any documented fevers.  Denies dysuria.  Reports shortness of breath and chest pain.  Prior to Admission medications   Medication Sig Start Date End Date Taking? Authorizing Provider  doxycycline  (VIBRAMYCIN ) 100 MG capsule Take 1 capsule (100 mg total) by mouth 2 (two) times daily. 05/03/23   Minnie Tinnie BRAVO, PA  naproxen  (NAPROSYN ) 500 MG tablet Take 1 tablet (500 mg total) by mouth 2 (two) times daily. 05/03/23   Minnie Tinnie BRAVO, PA    Allergies: Penicillins    Review of Systems  Constitutional:  Positive for chills. Negative for fever.  Respiratory:  Positive for shortness of breath.   Cardiovascular:  Positive for chest pain.  Gastrointestinal:  Negative for abdominal pain, nausea and vomiting.  Genitourinary:  Negative for dysuria.  Musculoskeletal:  Positive for back pain.  All other systems reviewed and are negative.   Updated Vital Signs BP 115/87   Pulse 92   Temp 98.9 F (37.2 C)   Resp 17   Ht 1.702 m (5' 7)   Wt 59 kg   LMP 01/04/2024 (Approximate)   SpO2 98%   Breastfeeding No   BMI 20.36 kg/m   Physical Exam Vitals and nursing note reviewed.  Constitutional:      Appearance:  She is well-developed.     Comments: Appears older than stated age  HENT:     Head: Normocephalic and atraumatic.     Mouth/Throat:     Mouth: Mucous membranes are moist.  Eyes:     Pupils: Pupils are equal, round, and reactive to light.  Cardiovascular:     Rate and Rhythm: Normal rate and regular rhythm.     Heart sounds: No murmur heard. Pulmonary:     Effort: Pulmonary effort is normal. No respiratory distress.     Breath sounds: No wheezing.  Abdominal:     General: Bowel sounds are normal.     Palpations: Abdomen is soft.     Tenderness: There is no abdominal tenderness. There is left CVA tenderness. There is no right CVA tenderness, guarding or rebound.  Musculoskeletal:     Cervical back: Neck supple.  Skin:    General: Skin is warm and dry.  Neurological:     Mental Status: She is alert and oriented to person, place, and time.  Psychiatric:        Mood and Affect: Mood normal.     (all labs ordered are listed, but only abnormal results are displayed) Labs Reviewed  URINALYSIS, ROUTINE W REFLEX MICROSCOPIC - Abnormal; Notable for the following components:      Result  Value   Protein, ur TRACE (*)    All other components within normal limits  CULTURE, BLOOD (ROUTINE X 2)  CULTURE, BLOOD (ROUTINE X 2)  RESP PANEL BY RT-PCR (RSV, FLU A&B, COVID)  RVPGX2  PREGNANCY, URINE  CBC WITH DIFFERENTIAL/PLATELET  COMPREHENSIVE METABOLIC PANEL WITH GFR  LACTIC ACID, PLASMA  LACTIC ACID, PLASMA  TROPONIN T, HIGH SENSITIVITY    EKG: None  Radiology: DG Chest Portable 1 View Result Date: 01/07/2024 CLINICAL DATA:  Chest pain EXAM: PORTABLE CHEST 1 VIEW COMPARISON:  Chest x-ray 05/03/2023 FINDINGS: The heart size and mediastinal contours are within normal limits. Both lungs are clear. No acute fractures are seen. There is dextroconvex curvature of the midthoracic spine. IMPRESSION: No active disease. Electronically Signed   By: Greig Pique M.D.   On: 01/07/2024 23:44     {Document cardiac monitor, telemetry assessment procedure when appropriate:32947} Procedures   Medications Ordered in the ED - No data to display    {Click here for ABCD2, HEART and other calculators REFRESH Note before signing:1}                              Medical Decision Making Amount and/or Complexity of Data Reviewed Labs: ordered. Radiology: ordered.   ***  {Document critical care time when appropriate  Document review of labs and clinical decision tools ie [id number], etc  Document your independent review of radiology images and any outside records  Document your discussion with family members, caretakers and with consultants  Document social determinants of health affecting pt's care  Document your decision making why or why not admission, treatments were needed:32947:::1}   Final diagnoses:  None    ED Discharge Orders     None

## 2024-01-08 LAB — RESP PANEL BY RT-PCR (RSV, FLU A&B, COVID)  RVPGX2
Influenza A by PCR: NEGATIVE
Influenza B by PCR: NEGATIVE
Resp Syncytial Virus by PCR: NEGATIVE
SARS Coronavirus 2 by RT PCR: NEGATIVE

## 2024-01-08 NOTE — ED Notes (Signed)
 RN returned to room, pt states she needs to leave to care for her child.  States she will return.  EDP at bedside as well to discuss with patient.  Pt leaving AMA.  Ambulatory with steady gait, chest rise even and unlabored, A&Ox4, in NAD at this time.

## 2024-01-08 NOTE — ED Notes (Signed)
 Attempted to stick patient for IV and blood work without success.  Robin, EMT also at bedside to attempt US  IV at this time but pt states she needs 10-64min to figure out her childcare at home because she might have to leave.  Requesting for RN to come back in at that time with update.
